# Patient Record
Sex: Female | Born: 1940 | Race: White | Hispanic: No | Marital: Married | Smoking: Never smoker
Health system: Southern US, Community
[De-identification: ages and names within clinical notes are randomized; demographics above are authoritative.]

## PROBLEM LIST (undated history)

## (undated) DIAGNOSIS — IMO0001 Reserved for inherently not codable concepts without codable children: Secondary | ICD-10-CM

## (undated) DIAGNOSIS — Z860100 Personal history of colon polyps, unspecified: Secondary | ICD-10-CM

## (undated) DIAGNOSIS — J189 Pneumonia, unspecified organism: Secondary | ICD-10-CM

## (undated) DIAGNOSIS — M549 Dorsalgia, unspecified: Secondary | ICD-10-CM

## (undated) DIAGNOSIS — F32A Depression, unspecified: Secondary | ICD-10-CM

## (undated) DIAGNOSIS — R531 Weakness: Secondary | ICD-10-CM

## (undated) DIAGNOSIS — M2540 Effusion, unspecified joint: Secondary | ICD-10-CM

## (undated) DIAGNOSIS — K649 Unspecified hemorrhoids: Secondary | ICD-10-CM

## (undated) DIAGNOSIS — E119 Type 2 diabetes mellitus without complications: Secondary | ICD-10-CM

## (undated) DIAGNOSIS — M810 Age-related osteoporosis without current pathological fracture: Secondary | ICD-10-CM

## (undated) DIAGNOSIS — I509 Heart failure, unspecified: Secondary | ICD-10-CM

## (undated) DIAGNOSIS — F329 Major depressive disorder, single episode, unspecified: Secondary | ICD-10-CM

## (undated) DIAGNOSIS — G2581 Restless legs syndrome: Secondary | ICD-10-CM

## (undated) DIAGNOSIS — E039 Hypothyroidism, unspecified: Secondary | ICD-10-CM

## (undated) DIAGNOSIS — K219 Gastro-esophageal reflux disease without esophagitis: Secondary | ICD-10-CM

## (undated) DIAGNOSIS — R51 Headache: Secondary | ICD-10-CM

## (undated) DIAGNOSIS — Z9109 Other allergy status, other than to drugs and biological substances: Secondary | ICD-10-CM

## (undated) DIAGNOSIS — R351 Nocturia: Secondary | ICD-10-CM

## (undated) DIAGNOSIS — G4730 Sleep apnea, unspecified: Secondary | ICD-10-CM

## (undated) DIAGNOSIS — M2550 Pain in unspecified joint: Secondary | ICD-10-CM

## (undated) DIAGNOSIS — E785 Hyperlipidemia, unspecified: Secondary | ICD-10-CM

## (undated) DIAGNOSIS — G629 Polyneuropathy, unspecified: Secondary | ICD-10-CM

## (undated) DIAGNOSIS — I10 Essential (primary) hypertension: Secondary | ICD-10-CM

## (undated) DIAGNOSIS — G8929 Other chronic pain: Secondary | ICD-10-CM

## (undated) DIAGNOSIS — R519 Headache, unspecified: Secondary | ICD-10-CM

## (undated) DIAGNOSIS — R112 Nausea with vomiting, unspecified: Secondary | ICD-10-CM

## (undated) DIAGNOSIS — R569 Unspecified convulsions: Secondary | ICD-10-CM

## (undated) DIAGNOSIS — Z9889 Other specified postprocedural states: Secondary | ICD-10-CM

## (undated) DIAGNOSIS — M1990 Unspecified osteoarthritis, unspecified site: Secondary | ICD-10-CM

## (undated) DIAGNOSIS — R350 Frequency of micturition: Secondary | ICD-10-CM

## (undated) HISTORY — PX: LUMBAR PUNCTURE: SHX1985

## (undated) HISTORY — PX: CERVICAL FUSION: SHX112

## (undated) HISTORY — PX: COLONOSCOPY: SHX174

## (undated) HISTORY — PX: BACK SURGERY: SHX140

## (undated) HISTORY — PX: VAGINA SURGERY: SHX829

## (undated) HISTORY — PX: CHOLECYSTECTOMY: SHX55

---

## 2001-08-11 ENCOUNTER — Encounter: Payer: Self-pay | Admitting: Neurosurgery

## 2001-08-11 ENCOUNTER — Ambulatory Visit (HOSPITAL_COMMUNITY): Admission: RE | Admit: 2001-08-11 | Discharge: 2001-08-11 | Payer: Self-pay | Admitting: Neurosurgery

## 2001-08-23 ENCOUNTER — Encounter: Payer: Self-pay | Admitting: Neurosurgery

## 2001-08-28 ENCOUNTER — Encounter: Payer: Self-pay | Admitting: Neurosurgery

## 2001-08-28 ENCOUNTER — Inpatient Hospital Stay (HOSPITAL_COMMUNITY): Admission: RE | Admit: 2001-08-28 | Discharge: 2001-08-30 | Payer: Self-pay | Admitting: Neurosurgery

## 2001-12-20 ENCOUNTER — Encounter: Admission: RE | Admit: 2001-12-20 | Discharge: 2001-12-20 | Payer: Self-pay | Admitting: Neurosurgery

## 2001-12-20 ENCOUNTER — Encounter: Payer: Self-pay | Admitting: Neurosurgery

## 2014-11-15 DIAGNOSIS — J189 Pneumonia, unspecified organism: Secondary | ICD-10-CM

## 2014-11-15 HISTORY — DX: Pneumonia, unspecified organism: J18.9

## 2015-02-23 ENCOUNTER — Other Ambulatory Visit: Payer: Self-pay | Admitting: Neurosurgery

## 2015-03-09 ENCOUNTER — Encounter (HOSPITAL_COMMUNITY)
Admission: RE | Admit: 2015-03-09 | Discharge: 2015-03-09 | Disposition: A | Discharge status: Home / Self Care | Payer: Medicare HMO | Source: Ambulatory Visit | Attending: Neurosurgery | Admitting: Neurosurgery

## 2015-03-09 ENCOUNTER — Encounter (HOSPITAL_COMMUNITY): Payer: Self-pay

## 2015-03-09 DIAGNOSIS — G4733 Obstructive sleep apnea (adult) (pediatric): Secondary | ICD-10-CM | POA: Diagnosis not present

## 2015-03-09 DIAGNOSIS — E119 Type 2 diabetes mellitus without complications: Secondary | ICD-10-CM | POA: Diagnosis not present

## 2015-03-09 DIAGNOSIS — E785 Hyperlipidemia, unspecified: Secondary | ICD-10-CM | POA: Insufficient documentation

## 2015-03-09 DIAGNOSIS — Z79899 Other long term (current) drug therapy: Secondary | ICD-10-CM | POA: Diagnosis not present

## 2015-03-09 DIAGNOSIS — I509 Heart failure, unspecified: Secondary | ICD-10-CM | POA: Diagnosis not present

## 2015-03-09 DIAGNOSIS — I10 Essential (primary) hypertension: Secondary | ICD-10-CM | POA: Diagnosis not present

## 2015-03-09 DIAGNOSIS — Z01812 Encounter for preprocedural laboratory examination: Secondary | ICD-10-CM | POA: Diagnosis not present

## 2015-03-09 DIAGNOSIS — Z7982 Long term (current) use of aspirin: Secondary | ICD-10-CM | POA: Diagnosis not present

## 2015-03-09 DIAGNOSIS — E039 Hypothyroidism, unspecified: Secondary | ICD-10-CM | POA: Insufficient documentation

## 2015-03-09 HISTORY — DX: Heart failure, unspecified: I50.9

## 2015-03-09 HISTORY — DX: Other specified postprocedural states: Z98.890

## 2015-03-09 HISTORY — DX: Reserved for inherently not codable concepts without codable children: IMO0001

## 2015-03-09 HISTORY — DX: Unspecified convulsions: R56.9

## 2015-03-09 HISTORY — DX: Hypothyroidism, unspecified: E03.9

## 2015-03-09 HISTORY — DX: Depression, unspecified: F32.A

## 2015-03-09 HISTORY — DX: Effusion, unspecified joint: M25.40

## 2015-03-09 HISTORY — DX: Pain in unspecified joint: M25.50

## 2015-03-09 HISTORY — DX: Personal history of colonic polyps: Z86.010

## 2015-03-09 HISTORY — DX: Type 2 diabetes mellitus without complications: E11.9

## 2015-03-09 HISTORY — DX: Headache, unspecified: R51.9

## 2015-03-09 HISTORY — DX: Unspecified hemorrhoids: K64.9

## 2015-03-09 HISTORY — DX: Age-related osteoporosis without current pathological fracture: M81.0

## 2015-03-09 HISTORY — DX: Dorsalgia, unspecified: M54.9

## 2015-03-09 HISTORY — DX: Hyperlipidemia, unspecified: E78.5

## 2015-03-09 HISTORY — DX: Nocturia: R35.1

## 2015-03-09 HISTORY — DX: Polyneuropathy, unspecified: G62.9

## 2015-03-09 HISTORY — DX: Weakness: R53.1

## 2015-03-09 HISTORY — DX: Frequency of micturition: R35.0

## 2015-03-09 HISTORY — DX: Personal history of colon polyps, unspecified: Z86.0100

## 2015-03-09 HISTORY — DX: Restless legs syndrome: G25.81

## 2015-03-09 HISTORY — DX: Headache: R51

## 2015-03-09 HISTORY — DX: Major depressive disorder, single episode, unspecified: F32.9

## 2015-03-09 HISTORY — DX: Unspecified osteoarthritis, unspecified site: M19.90

## 2015-03-09 HISTORY — DX: Other chronic pain: G89.29

## 2015-03-09 HISTORY — DX: Sleep apnea, unspecified: G47.30

## 2015-03-09 HISTORY — DX: Nausea with vomiting, unspecified: R11.2

## 2015-03-09 HISTORY — DX: Essential (primary) hypertension: I10

## 2015-03-09 HISTORY — DX: Pneumonia, unspecified organism: J18.9

## 2015-03-09 HISTORY — DX: Other allergy status, other than to drugs and biological substances: Z91.09

## 2015-03-09 LAB — BASIC METABOLIC PANEL
Anion gap: 10 (ref 5–15)
BUN: 19 mg/dL (ref 6–20)
CHLORIDE: 99 mmol/L — AB (ref 101–111)
CO2: 28 mmol/L (ref 22–32)
CREATININE: 0.98 mg/dL (ref 0.44–1.00)
Calcium: 9.9 mg/dL (ref 8.9–10.3)
GFR calc Af Amer: >60 mL/min (ref >60)
GFR calc non Af Amer: 56 mL/min — ABNORMAL LOW (ref >60)
GLUCOSE: 103 mg/dL — AB (ref 65–99)
POTASSIUM: 4 mmol/L (ref 3.5–5.1)
SODIUM: 137 mmol/L (ref 135–145)

## 2015-03-09 LAB — CBC
HCT: 41.9 % (ref 36.0–46.0)
Hemoglobin: 14.1 g/dL (ref 12.0–15.0)
MCH: 31.1 pg (ref 26.0–34.0)
MCHC: 33.7 g/dL (ref 30.0–36.0)
MCV: 92.5 fL (ref 78.0–100.0)
PLATELETS: 291 10*3/uL (ref 150–400)
RBC: 4.53 MIL/uL (ref 3.87–5.11)
RDW: 12.7 % (ref 11.5–15.5)
WBC: 7.7 10*3/uL (ref 4.0–10.5)

## 2015-03-09 LAB — SURGICAL PCR SCREEN
MRSA, PCR: NEGATIVE
Staphylococcus aureus: POSITIVE — AB

## 2015-03-09 LAB — GLUCOSE, CAPILLARY: GLUCOSE-CAPILLARY: 100 mg/dL — AB (ref 65–99)

## 2015-03-09 NOTE — Progress Notes (Signed)
[facility] [address] in [place]

## 2015-03-09 NOTE — Progress Notes (Signed)
Red,open area noticed on right shoulder.When I asked what happened she said she scratched the spot but another one noted on back. I notified Erie Noe bc it's in close proximity to where surgery will take place.Erie Noe will let Dr.Cram know.

## 2015-03-09 NOTE — Pre-Procedure Instructions (Addendum)
Chelsea Bowman  03/09/2015      [id number] [place], [place] - [address]  [place] [address] Phone: [phone] Fax: [phone]  [facility] [place], [place] - [address] [place] [address] Phone: [phone] Fax: [phone]    Your procedure is scheduled on Wed. Aug 31 @ 9:30 AM  Report to [facility] Admitting at 7:30 AM.  Call this number if you have problems the morning of surgery:  [phone]   Remember:  Do not eat food or drink liquids after midnight.  Take these medicines the morning of surgery with A SIP OF WATER Carvedilol(Coreg),Prozac(Fluoxetine),Synthroid(Levothyroxine), and Claritin(Loratadine)  How to Manage Your Diabetes Before Surgery   Why is it important to control my blood sugar before and after surgery?   Improving blood sugar levels before and after surgery helps healing and can limit problems.  A way of improving blood sugar control is eating a healthy diet by:  - Eating less sugar and carbohydrates  - Increasing activity/exercise  - Talk with your doctor about reaching your blood sugar goals  High blood sugars (greater than 180 mg/dL) can raise your risk of infections and slow down your recovery so you will need to focus on controlling your diabetes during the weeks before surgery.  Make sure that the doctor who takes care of your diabetes knows about your planned surgery including the date and location.  How do I manage my blood sugars before surgery?   Check your blood sugar at least 4 times a day, 2 days before surgery to make sure that they are not too high or low.   Check your blood sugar the morning of your surgery when you wake up and every 2               hours until you get to the Short-Stay unit.  If your blood sugar is less than 70 mg/dL, you will need to treat for low blood sugar by:  Treat a low blood sugar (less than 70  mg/dL) with 1/2 cup of clear juice (cranberry or apple), 4 glucose tablets, OR glucose gel.  Recheck blood sugar in 15 minutes after treatment (to make sure it is greater than 70 mg/dL).  If blood sugar is not greater than 70 mg/dL on re-check, call [phone] for further instructions.   Report your blood sugar to the Short-Stay nurse when you get to Short-Stay.  References:  [facility], 2007 "How to Manage your Diabetes Before and After Surgery".  What do I do about my diabetes medications?   Do not take oral diabetes medicines (pills) the morning of surgery.          Do not take other diabetes injectables the day of surgery including Byetta, Victoza, Bydureon, and Trulicity.    If your CBG is greater than 220 mg/dL, you may take 1/2 of your sliding scale (correction) dose of insulin.   For patients with "Insulin Pumps":  Contact your diabetes doctor for specific instructions before surgery.   Decrease basal insulin rates by 20% at midnight the night before surgery.  Note that if your surgery is planned to be longer than 2 hours, your insulin pump will be removed and intravenous (IV) insulin will be started and managed by the nurses and anesthesiologist.  You will be able to restart your insulin pump once you are awake and able to  manage it.  Make sure to bring insulin pump supplies to the hospital with you in case your site needs to be changed.                     Stop taking your Mobic,Ibuprofen,and Aspirin. No Goody's,BC's,Aleve,Fish Oil,or any Herbal Medications.    Do not wear jewelry, make-up or nail polish.  Do not wear lotions, powders, or perfumes.  You may wear deodorant.  Do not shave 48 hours prior to surgery.  Men may shave face and neck.  Do not bring valuables to the hospital.  [facility] is not responsible for any belongings or valuables.  Contacts, dentures or bridgework may not be worn into surgery.  Leave your  suitcase in the car.  After surgery it may be brought to your room.  For patients admitted to the hospital, discharge time will be determined by your treatment team.  Patients discharged the day of surgery will not be allowed to drive home.    Special instructions:  [place] - Preparing for Surgery  Before surgery, you can play an important role.  Because skin is not sterile, your skin needs to be as free of germs as possible.  You can reduce the number of germs on you skin by washing with CHG (chlorahexidine gluconate) soap before surgery.  CHG is an antiseptic cleaner which kills germs and bonds with the skin to continue killing germs even after washing.  Please DO NOT use if you have an allergy to CHG or antibacterial soaps.  If your skin becomes reddened/irritated stop using the CHG and inform your nurse when you arrive at Short Stay.  Do not shave (including legs and underarms) for at least 48 hours prior to the first CHG shower.  You may shave your face.  Please follow these instructions carefully:   1.  Shower with CHG Soap the night before surgery and the                                morning of Surgery.  2.  If you choose to wash your hair, wash your hair first as usual with your       normal shampoo.  3.  After you shampoo, rinse your hair and body thoroughly to remove the                      Shampoo.  4.  Use CHG as you would any other liquid soap.  You can apply chg directly       to the skin and wash gently with scrungie or a clean washcloth.  5.  Apply the CHG Soap to your body ONLY FROM THE NECK DOWN.        Do not use on open wounds or open sores.  Avoid contact with your eyes,       ears, mouth and genitals (private parts).  Wash genitals (private parts)       with your normal soap.  6.  Wash thoroughly, paying special attention to the area where your surgery        will be performed.  7.  Thoroughly rinse your body with warm water from the neck down.  8.  DO NOT  shower/wash with your normal soap after using and rinsing off       the CHG Soap.  9.  Pat yourself dry with a clean  towel.            10.  Wear clean pajamas.            11.  Place clean sheets on your bed the night of your first shower and do not        sleep with pets.  Day of Surgery  Do not apply any lotions/deoderants the morning of surgery.  Please wear clean clothes to the hospital/surgery center.    Please read over the following fact sheets that you were given. Pain Booklet, Coughing and Deep Breathing, MRSA Information and Surgical Site Infection Prevention

## 2015-03-09 NOTE — Progress Notes (Addendum)
Cardiologist is Dr.Smull in [place] saw one time about 10yrs ago [phone]) [phone]) visit under care everywhere  Sees PA Leretha Pol in [place] on [facility]  Echo unsure   Stress test unsure   Heart cath under care everywhere from 2014  EKG under care everywhere from 11/2014  CXR under care everywhere from 2016  Saw Pulmonologist about 3 yrs ago for shortness of breath [facility] Chest Specialist [phone]

## 2015-03-10 LAB — HEMOGLOBIN A1C
Hgb A1c MFr Bld: 6.2 % — ABNORMAL HIGH (ref 4.8–5.6)
Mean Plasma Glucose: 131 mg/dL

## 2015-03-11 ENCOUNTER — Encounter (HOSPITAL_COMMUNITY): Payer: Self-pay | Admitting: Emergency Medicine

## 2015-03-11 ENCOUNTER — Encounter (HOSPITAL_COMMUNITY): Payer: Self-pay

## 2015-03-11 NOTE — Progress Notes (Addendum)
Anesthesia Chart Review:  Pt is 74 year old female scheduled for C2-3 posterior cervical fusion with lateral mass fixation on 03/17/2015 with Dr. Wynetta Emery.   PMH includes: CHF, HTN, DM, hyperlipidemia, hypothyroidism, OSA (not on CPAP), remote history seizures. Never smoker. BMI 33.   Hospitalized 5/9-5/05/2015 (see care everywhere) for acute respiratory failure due to bilateral pneumonia.   Medications include: ASA, carvedilol, lasix, levothyroxine, lovastatin, metformin, potassium, mirapex.   Preoperative labs reviewed.  HgbA1c 6.2, glucose 103.   EKG 11/20/2014: NSR. Possible anterior infarct. Inferior infarct, age undetermined. Per Dr. Sima Matas' interpretation, no significant change when compared to prior tracing dated 04/16/2013.   Carotid duplex US 08/22/2013: 1. No hemodynamically significant stenosis on either side. Minimal atherosclerotic vascular disease at both carotid bifurcations.  2. Both vertebral arteries are patent with antegrade flow.  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 -The left ventricular ejection fraction is normal (55-60%). -The left atrium is mildly dilated. -Grade I mild diastolic dysfunction; abnormal relaxation pattern. -There is normal left ventricular wall thickness. -The left ventricular wall motion is normal. -The right ventricular ejection fraction is normal.  Per interpretation of [facility] Dr. Sima Matas, EKG stable since 04/16/13, which is the date of pt's normal cardiac cath. If no changes, I anticipate pt can proceed with surgery as scheduled.   Rica Mast, FNP-BC [facility] Short Stay Surgical Center/Anesthesiology Phone: [phone] 03/11/2015 2:20 PM

## 2015-03-16 MED ORDER — DEXAMETHASONE SODIUM PHOSPHATE 10 MG/ML IJ SOLN: 10.0000 mg | INTRAMUSCULAR | Status: DC

## 2015-03-16 MED ORDER — CEFAZOLIN SODIUM-DEXTROSE 2-3 GM-% IV SOLR: 2.0000 g | INTRAVENOUS | Status: DC

## 2015-03-16 NOTE — Progress Notes (Signed)
Left message for daughter and pt about new arrival time being 0930.I asked that one of them call back to confirm new time.

## 2015-03-17 ENCOUNTER — Encounter (HOSPITAL_COMMUNITY): Admission: RE | Payer: Medicare HMO | Source: Ambulatory Visit

## 2015-03-17 ENCOUNTER — Inpatient Hospital Stay (HOSPITAL_COMMUNITY): Admission: RE | Admit: 2015-03-17 | Payer: Medicare HMO | Source: Ambulatory Visit | Admitting: Neurosurgery

## 2015-03-17 SURGERY — POSTERIOR CERVICAL FUSION/FORAMINOTOMY LEVEL 1
Anesthesia: General

## 2015-06-14 ENCOUNTER — Other Ambulatory Visit (HOSPITAL_COMMUNITY): Payer: Self-pay | Admitting: Neurosurgery

## 2015-06-30 ENCOUNTER — Encounter (HOSPITAL_COMMUNITY)
Admission: RE | Admit: 2015-06-30 | Discharge: 2015-06-30 | Disposition: A | Discharge status: Home / Self Care | Payer: Medicare HMO | Source: Ambulatory Visit | Attending: Neurosurgery | Admitting: Neurosurgery

## 2015-06-30 ENCOUNTER — Encounter (HOSPITAL_COMMUNITY): Payer: Self-pay

## 2015-06-30 DIAGNOSIS — Z7984 Long term (current) use of oral hypoglycemic drugs: Secondary | ICD-10-CM | POA: Diagnosis not present

## 2015-06-30 DIAGNOSIS — E039 Hypothyroidism, unspecified: Secondary | ICD-10-CM | POA: Insufficient documentation

## 2015-06-30 DIAGNOSIS — Z01812 Encounter for preprocedural laboratory examination: Secondary | ICD-10-CM | POA: Insufficient documentation

## 2015-06-30 DIAGNOSIS — Z01818 Encounter for other preprocedural examination: Secondary | ICD-10-CM | POA: Insufficient documentation

## 2015-06-30 DIAGNOSIS — G4733 Obstructive sleep apnea (adult) (pediatric): Secondary | ICD-10-CM | POA: Diagnosis not present

## 2015-06-30 DIAGNOSIS — Z7982 Long term (current) use of aspirin: Secondary | ICD-10-CM | POA: Diagnosis not present

## 2015-06-30 DIAGNOSIS — Z0183 Encounter for blood typing: Secondary | ICD-10-CM | POA: Insufficient documentation

## 2015-06-30 DIAGNOSIS — Z79899 Other long term (current) drug therapy: Secondary | ICD-10-CM | POA: Diagnosis not present

## 2015-06-30 DIAGNOSIS — E785 Hyperlipidemia, unspecified: Secondary | ICD-10-CM | POA: Diagnosis not present

## 2015-06-30 DIAGNOSIS — E119 Type 2 diabetes mellitus without complications: Secondary | ICD-10-CM | POA: Insufficient documentation

## 2015-06-30 DIAGNOSIS — I110 Hypertensive heart disease with heart failure: Secondary | ICD-10-CM | POA: Insufficient documentation

## 2015-06-30 DIAGNOSIS — I509 Heart failure, unspecified: Secondary | ICD-10-CM | POA: Diagnosis not present

## 2015-06-30 LAB — TYPE AND SCREEN
ABO/RH(D): A POS
Antibody Screen: NEGATIVE

## 2015-06-30 LAB — SURGICAL PCR SCREEN
MRSA, PCR: NEGATIVE
Staphylococcus aureus: NEGATIVE

## 2015-06-30 LAB — CBC
HCT: 44 % (ref 36.0–46.0)
Hemoglobin: 14 g/dL (ref 12.0–15.0)
MCH: 29.7 pg (ref 26.0–34.0)
MCHC: 31.8 g/dL (ref 30.0–36.0)
MCV: 93.2 fL (ref 78.0–100.0)
PLATELETS: 287 10*3/uL (ref 150–400)
RBC: 4.72 MIL/uL (ref 3.87–5.11)
RDW: 12.5 % (ref 11.5–15.5)
WBC: 6.5 10*3/uL (ref 4.0–10.5)

## 2015-06-30 LAB — BASIC METABOLIC PANEL
Anion gap: 7 (ref 5–15)
BUN: 14 mg/dL (ref 6–20)
CALCIUM: 9.9 mg/dL (ref 8.9–10.3)
CHLORIDE: 103 mmol/L (ref 101–111)
CO2: 28 mmol/L (ref 22–32)
CREATININE: 0.94 mg/dL (ref 0.44–1.00)
GFR calc non Af Amer: 59 mL/min — ABNORMAL LOW (ref >60)
Glucose, Bld: 108 mg/dL — ABNORMAL HIGH (ref 65–99)
Potassium: 4.7 mmol/L (ref 3.5–5.1)
SODIUM: 138 mmol/L (ref 135–145)

## 2015-06-30 LAB — GLUCOSE, CAPILLARY: GLUCOSE-CAPILLARY: 102 mg/dL — AB (ref 65–99)

## 2015-06-30 LAB — ABO/RH: ABO/RH(D): A POS

## 2015-06-30 NOTE — Progress Notes (Addendum)
PCP: Florene Glenkatie wingate, PA@ [facility] Neurologist: Dr. Larose KellsLauve, pt. Also sees her for sleep apnea. States she doesn't use cpap-uncomfortable and she is up and down at night. Sleep study done 2 yrs. Ago-doesn't remember the doctor or [place].  Pt. Doesn't see a cardiologist at present. Saw Dr.Smull in [place] and he released her back in 2014.  Will request ekg tracing at [facility]. Pt. Reports she doesn't check blood sugars. Encourage her to do so and also on the day of surgery.  Pt. Stated why this surgery was cancelled back in August was due to a rash that developed on her right shoulder. States it clear now and they didn't know what caused it.

## 2015-06-30 NOTE — Pre-Procedure Instructions (Addendum)
Chelsea Bowman  06/30/2015      [id number] [place], [place] - [address] [place] [address] Phone: [phone] Fax: [phone]  [facility] [place], [place] - [address] [place] [address] Phone: [phone] Fax: [phone]    Your procedure is scheduled on Monday, Dec. 19  Report to [facility] Admitting at 11:55  A.M.  Call this number if you have problems the morning of surgery:  [phone]   Remember:  Do not eat food or drink liquids after midnight on Sunday Dec. 18  Take these medicines the morning of surgery with A SIP OF WATER : albuterol if needed-bring to hospital, carvedilol(coreg), prozac, levothyroxine(synthroid), claritin               Stop:meloxicam (mobic), ibuprofen, advil, motrin, aspirin, and vitamins today.                How to Manage Your Diabetes Before Surgery   Why is it important to control my blood sugar before and after surgery?   Improving blood sugar levels before and after surgery helps healing and can limit problems.  A way of improving blood sugar control is eating a healthy diet by:  - Eating less sugar and carbohydrates  - Increasing activity/exercise  - Talk with your doctor about reaching your blood sugar goals  High blood sugars (greater than 180 mg/dL) can raise your risk of infections and slow down your recovery so you will need to focus on controlling your diabetes during the weeks before surgery.  Make sure that the doctor who takes care of your diabetes knows about your planned surgery including the date and location.  How do I manage my blood sugars before surgery?   Check your blood sugar at least 4 times a day, 2 days before surgery to make sure that they are not too high or low.   Check your blood sugar the morning of your surgery when you wake up and every 2               hours until you get to the Short-Stay  unit.  If your blood sugar is less than 70 mg/dL, you will need to treat for low blood sugar by:  Treat a low blood sugar (less than 70 mg/dL) with 1/2 cup of clear juice (cranberry or apple), 4 glucose tablets, OR glucose gel.  Recheck blood sugar in 15 minutes after treatment (to make sure it is greater than 70 mg/dL).  If blood sugar is not greater than 70 mg/dL on re-check, call [phone] for further instructions.   Report your blood sugar to the Short-Stay nurse when you get to Short-Stay.  References:  [facility], 2007 "How to Manage your Diabetes Before and After Surgery".  What do I do about my diabetes medications?    Do not take oral diabetes medicines (pills) the morning of surgery.    Do not wear jewelry, make-up or nail polish.  Do not wear lotions, powders, or perfumes.  You may wear deodorant.  Do not shave 48 hours prior to surgery.  Men may shave face and neck.  Do not bring valuables to the hospital.  [facility] is not responsible for any belongings or valuables.  Contacts, dentures or bridgework may not be worn into surgery.  Leave your suitcase in the car.  After surgery it may be  brought to your room.  For patients admitted to the hospital, discharge time will be determined by your treatment team.  Patients discharged the day of surgery will not be allowed to drive home.    Special instructions: review preparing for surgery  Please read over the following fact sheets that you were given. Pain Booklet, Coughing and Deep Breathing, Blood Transfusion Information, MRSA Information and Surgical Site Infection Prevention

## 2015-07-01 LAB — HEMOGLOBIN A1C
HEMOGLOBIN A1C: 6 % — AB (ref 4.8–5.6)
Mean Plasma Glucose: 126 mg/dL

## 2015-07-01 NOTE — Progress Notes (Signed)
Anesthesia Chart Review:  Pt is 74 year old female scheduled for C2-3 posterior cervical fusion with lateral mass fixation on 07/05/2015 with Dr. Wynetta Emeryram.   Pt was originally scheduled for this surgery back in August but it was cancelled due to a rash.   PMH includes: CHF, HTN, DM, hyperlipidemia, hypothyroidism, OSA (not on CPAP), remote history seizures. Never smoker. BMI 33.5.   Hospitalized 5/9-5/05/2015 (see care everywhere) for acute respiratory failure due to bilateral pneumonia.   Medications include: albuterol, ASA, carvedilol, lasix, levothyroxine, lovastatin, metformin, potassium, mirapex.   Preoperative labs reviewed. HgbA1c 6.0, glucose 108.   EKG 11/20/2014 (care everywhere): NSR. Possible anterior infarct. Inferior infarct, age undetermined. Per Dr. Sima MatasSteve Collins' interpretation, no significant change when compared to prior tracing dated 04/16/2013.   Carotid duplex US 08/22/2013 (care everywhere): 1. No hemodynamically significant stenosis on either side. Minimal atherosclerotic vascular disease at both carotid bifurcations.  2. Both vertebral arteries are patent with antegrade flow.  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 -The left ventricular ejection fraction is normal (55-60%). -The left atrium is mildly dilated. -Grade I mild diastolic dysfunction; abnormal relaxation pattern. -There is normal left ventricular wall thickness. -The left ventricular wall motion is normal. -The right ventricular ejection fraction is normal.  Per interpretation of [facility] Dr. Sima MatasSteve Collins, EKG stable since 04/16/13, which is the date of pt's normal cardiac cath. If no changes, I anticipate pt can proceed with surgery as scheduled.   Rica Mastngela Josedaniel Haye, FNP-BC [facility] Short Stay Surgical  Center/Anesthesiology Phone: [phone] 07/01/2015 2:57 PM

## 2015-07-02 NOTE — Progress Notes (Signed)
[facility], spoke with Morrie SheldonAshley , she will fax ekg again, she reports that they did rec. request & she had faxed it to us on 06/30/2015

## 2015-07-04 MED ORDER — DEXAMETHASONE SODIUM PHOSPHATE 10 MG/ML IJ SOLN
10.0000 mg | INTRAMUSCULAR | Status: DC
  Filled 2015-07-04: qty 1

## 2015-07-04 MED ORDER — CEFAZOLIN SODIUM-DEXTROSE 2-3 GM-% IV SOLR
2.0000 g | INTRAVENOUS | Status: AC
  Administered 2015-07-05: 2 g via INTRAVENOUS
  Filled 2015-07-04: qty 50

## 2015-07-05 ENCOUNTER — Encounter (HOSPITAL_COMMUNITY)
Admission: RE | Disposition: A | Discharge status: Home / Self Care | Payer: Self-pay | Source: Ambulatory Visit | Attending: Neurosurgery

## 2015-07-05 ENCOUNTER — Inpatient Hospital Stay (HOSPITAL_COMMUNITY): Payer: Medicare HMO

## 2015-07-05 ENCOUNTER — Inpatient Hospital Stay (HOSPITAL_COMMUNITY): Payer: Medicare HMO | Admitting: Certified Registered Nurse Anesthetist

## 2015-07-05 ENCOUNTER — Inpatient Hospital Stay (HOSPITAL_COMMUNITY)
Admission: RE | Admit: 2015-07-05 | Discharge: 2015-07-06 | DRG: 472 | Disposition: A | Discharge status: Home / Self Care | Payer: Medicare HMO | Source: Ambulatory Visit | Attending: Neurosurgery | Admitting: Neurosurgery

## 2015-07-05 ENCOUNTER — Inpatient Hospital Stay (HOSPITAL_COMMUNITY): Payer: Medicare HMO | Admitting: Emergency Medicine

## 2015-07-05 ENCOUNTER — Encounter (HOSPITAL_COMMUNITY): Payer: Self-pay | Admitting: General Practice

## 2015-07-05 DIAGNOSIS — M4802 Spinal stenosis, cervical region: Secondary | ICD-10-CM | POA: Diagnosis present

## 2015-07-05 DIAGNOSIS — I509 Heart failure, unspecified: Secondary | ICD-10-CM | POA: Diagnosis present

## 2015-07-05 DIAGNOSIS — Z419 Encounter for procedure for purposes other than remedying health state, unspecified: Secondary | ICD-10-CM

## 2015-07-05 DIAGNOSIS — Z79899 Other long term (current) drug therapy: Secondary | ICD-10-CM | POA: Diagnosis not present

## 2015-07-05 DIAGNOSIS — F329 Major depressive disorder, single episode, unspecified: Secondary | ICD-10-CM | POA: Diagnosis present

## 2015-07-05 DIAGNOSIS — M810 Age-related osteoporosis without current pathological fracture: Secondary | ICD-10-CM | POA: Diagnosis present

## 2015-07-05 DIAGNOSIS — G4730 Sleep apnea, unspecified: Secondary | ICD-10-CM | POA: Diagnosis present

## 2015-07-05 DIAGNOSIS — M4712 Other spondylosis with myelopathy, cervical region: Secondary | ICD-10-CM | POA: Diagnosis present

## 2015-07-05 DIAGNOSIS — Z981 Arthrodesis status: Secondary | ICD-10-CM | POA: Diagnosis not present

## 2015-07-05 DIAGNOSIS — Z86010 Personal history of colonic polyps: Secondary | ICD-10-CM

## 2015-07-05 DIAGNOSIS — G8929 Other chronic pain: Secondary | ICD-10-CM | POA: Diagnosis present

## 2015-07-05 DIAGNOSIS — E1140 Type 2 diabetes mellitus with diabetic neuropathy, unspecified: Secondary | ICD-10-CM | POA: Diagnosis present

## 2015-07-05 DIAGNOSIS — Z7982 Long term (current) use of aspirin: Secondary | ICD-10-CM

## 2015-07-05 DIAGNOSIS — Z7984 Long term (current) use of oral hypoglycemic drugs: Secondary | ICD-10-CM | POA: Diagnosis not present

## 2015-07-05 DIAGNOSIS — G2581 Restless legs syndrome: Secondary | ICD-10-CM | POA: Diagnosis present

## 2015-07-05 DIAGNOSIS — M542 Cervicalgia: Secondary | ICD-10-CM | POA: Diagnosis present

## 2015-07-05 DIAGNOSIS — E785 Hyperlipidemia, unspecified: Secondary | ICD-10-CM | POA: Diagnosis present

## 2015-07-05 DIAGNOSIS — I110 Hypertensive heart disease with heart failure: Secondary | ICD-10-CM | POA: Diagnosis present

## 2015-07-05 DIAGNOSIS — E039 Hypothyroidism, unspecified: Secondary | ICD-10-CM | POA: Diagnosis present

## 2015-07-05 DIAGNOSIS — G959 Disease of spinal cord, unspecified: Secondary | ICD-10-CM | POA: Diagnosis present

## 2015-07-05 HISTORY — PX: POSTERIOR CERVICAL FUSION/FORAMINOTOMY: SHX5038

## 2015-07-05 LAB — GLUCOSE, CAPILLARY
GLUCOSE-CAPILLARY: 118 mg/dL — AB (ref 65–99)
Glucose-Capillary: 117 mg/dL — ABNORMAL HIGH (ref 65–99)
Glucose-Capillary: 180 mg/dL — ABNORMAL HIGH (ref 65–99)

## 2015-07-05 SURGERY — POSTERIOR CERVICAL FUSION/FORAMINOTOMY LEVEL 1
Anesthesia: General | Site: Neck

## 2015-07-05 MED ORDER — POTASSIUM CHLORIDE ER 10 MEQ PO TBCR
10.0000 meq | EXTENDED_RELEASE_TABLET | Freq: Two times a day (BID) | ORAL | Status: DC
  Administered 2015-07-05 – 2015-07-06 (×2): 10 meq via ORAL
  Filled 2015-07-05 (×5): qty 1

## 2015-07-05 MED ORDER — OXYCODONE-ACETAMINOPHEN 5-325 MG PO TABS
1.0000 | ORAL_TABLET | ORAL | Status: DC | PRN
  Administered 2015-07-05 – 2015-07-06 (×5): 2 via ORAL
  Filled 2015-07-05 (×4): qty 2

## 2015-07-05 MED ORDER — ONDANSETRON HCL 4 MG/2ML IJ SOLN: 4.0000 mg | INTRAMUSCULAR | Status: DC | PRN

## 2015-07-05 MED ORDER — MIDAZOLAM HCL 2 MG/2ML IJ SOLN
INTRAMUSCULAR | Status: AC
  Filled 2015-07-05: qty 2

## 2015-07-05 MED ORDER — LIDOCAINE HCL (CARDIAC) 20 MG/ML IV SOLN
INTRAVENOUS | Status: AC
  Filled 2015-07-05: qty 5

## 2015-07-05 MED ORDER — EPHEDRINE SULFATE 50 MG/ML IJ SOLN
INTRAMUSCULAR | Status: DC | PRN
  Administered 2015-07-05: 5 mg via INTRAVENOUS
  Administered 2015-07-05: 10 mg via INTRAVENOUS
  Administered 2015-07-05: 15 mg via INTRAVENOUS

## 2015-07-05 MED ORDER — VITAMIN D3 25 MCG (1000 UNIT) PO TABS
1000.0000 [IU] | ORAL_TABLET | Freq: Every day | ORAL | Status: DC
  Administered 2015-07-06: 1000 [IU] via ORAL
  Filled 2015-07-05 (×2): qty 1

## 2015-07-05 MED ORDER — LIDOCAINE HCL (CARDIAC) 20 MG/ML IV SOLN
INTRAVENOUS | Status: DC | PRN
  Administered 2015-07-05: 60 mg via INTRAVENOUS

## 2015-07-05 MED ORDER — NEOSTIGMINE METHYLSULFATE 10 MG/10ML IV SOLN
INTRAVENOUS | Status: DC | PRN
  Administered 2015-07-05: 3 mg via INTRAVENOUS

## 2015-07-05 MED ORDER — 0.9 % SODIUM CHLORIDE (POUR BTL) OPTIME
TOPICAL | Status: DC | PRN
  Administered 2015-07-05: 1000 mL

## 2015-07-05 MED ORDER — FENTANYL CITRATE (PF) 250 MCG/5ML IJ SOLN
INTRAMUSCULAR | Status: AC
  Filled 2015-07-05: qty 5

## 2015-07-05 MED ORDER — ONDANSETRON HCL 4 MG/2ML IJ SOLN: 4.0000 mg | Freq: Once | INTRAMUSCULAR | Status: DC | PRN

## 2015-07-05 MED ORDER — CYCLOBENZAPRINE HCL 10 MG PO TABS
ORAL_TABLET | ORAL | Status: AC
  Filled 2015-07-05: qty 1

## 2015-07-05 MED ORDER — FENTANYL CITRATE (PF) 100 MCG/2ML IJ SOLN
INTRAMUSCULAR | Status: DC | PRN
  Administered 2015-07-05 (×5): 50 ug via INTRAVENOUS

## 2015-07-05 MED ORDER — CARVEDILOL 6.25 MG PO TABS
6.2500 mg | ORAL_TABLET | Freq: Two times a day (BID) | ORAL | Status: DC
  Administered 2015-07-06: 6.25 mg via ORAL
  Filled 2015-07-05: qty 1

## 2015-07-05 MED ORDER — SODIUM CHLORIDE 0.9 % IR SOLN
Status: DC | PRN
  Administered 2015-07-05: 16:00:00

## 2015-07-05 MED ORDER — PROPOFOL 10 MG/ML IV BOLUS
INTRAVENOUS | Status: AC
  Filled 2015-07-05: qty 20

## 2015-07-05 MED ORDER — LORATADINE 10 MG PO TABS
10.0000 mg | ORAL_TABLET | Freq: Every day | ORAL | Status: DC
  Administered 2015-07-06: 10 mg via ORAL
  Filled 2015-07-05: qty 1

## 2015-07-05 MED ORDER — ARTIFICIAL TEARS OP OINT
TOPICAL_OINTMENT | OPHTHALMIC | Status: AC
  Filled 2015-07-05: qty 3.5

## 2015-07-05 MED ORDER — VANCOMYCIN HCL 1000 MG IV SOLR
INTRAVENOUS | Status: AC
  Filled 2015-07-05: qty 1000

## 2015-07-05 MED ORDER — ONDANSETRON HCL 4 MG/2ML IJ SOLN
INTRAMUSCULAR | Status: AC
  Filled 2015-07-05: qty 2

## 2015-07-05 MED ORDER — PROPOFOL 10 MG/ML IV BOLUS
INTRAVENOUS | Status: DC | PRN
  Administered 2015-07-05: 120 mg via INTRAVENOUS
  Administered 2015-07-05: 30 mg via INTRAVENOUS

## 2015-07-05 MED ORDER — ACETAMINOPHEN 325 MG PO TABS: 650.0000 mg | ORAL_TABLET | ORAL | Status: DC | PRN

## 2015-07-05 MED ORDER — ONDANSETRON HCL 4 MG/2ML IJ SOLN
INTRAMUSCULAR | Status: DC | PRN
  Administered 2015-07-05: 4 mg via INTRAVENOUS

## 2015-07-05 MED ORDER — LIDOCAINE-EPINEPHRINE 1 %-1:100000 IJ SOLN
INTRAMUSCULAR | Status: DC | PRN
  Administered 2015-07-05: 9 mL

## 2015-07-05 MED ORDER — MIDAZOLAM HCL 5 MG/5ML IJ SOLN
INTRAMUSCULAR | Status: DC | PRN
  Administered 2015-07-05: 1 mg via INTRAVENOUS

## 2015-07-05 MED ORDER — ROCURONIUM BROMIDE 100 MG/10ML IV SOLN
INTRAVENOUS | Status: DC | PRN
  Administered 2015-07-05: 10 mg via INTRAVENOUS
  Administered 2015-07-05: 40 mg via INTRAVENOUS
  Administered 2015-07-05: 10 mg via INTRAVENOUS

## 2015-07-05 MED ORDER — FENTANYL CITRATE (PF) 100 MCG/2ML IJ SOLN
INTRAMUSCULAR | Status: AC
  Administered 2015-07-05: 25 ug via INTRAVENOUS
  Filled 2015-07-05: qty 2

## 2015-07-05 MED ORDER — FENTANYL CITRATE (PF) 100 MCG/2ML IJ SOLN
25.0000 ug | INTRAMUSCULAR | Status: DC | PRN
  Administered 2015-07-05: 50 ug via INTRAVENOUS
  Administered 2015-07-05 (×2): 25 ug via INTRAVENOUS

## 2015-07-05 MED ORDER — FLUOXETINE HCL 20 MG PO CAPS
40.0000 mg | ORAL_CAPSULE | Freq: Every day | ORAL | Status: DC
  Administered 2015-07-06: 40 mg via ORAL
  Filled 2015-07-05: qty 2

## 2015-07-05 MED ORDER — ALBUTEROL SULFATE HFA 108 (90 BASE) MCG/ACT IN AERS
1.0000 | INHALATION_SPRAY | Freq: Four times a day (QID) | RESPIRATORY_TRACT | Status: DC | PRN
Start: 2015-07-05 — End: 2015-07-05

## 2015-07-05 MED ORDER — HYDROMORPHONE HCL 1 MG/ML IJ SOLN: 0.5000 mg | INTRAMUSCULAR | Status: DC | PRN

## 2015-07-05 MED ORDER — SODIUM CHLORIDE 0.9 % IJ SOLN
3.0000 mL | Freq: Two times a day (BID) | INTRAMUSCULAR | Status: DC
  Administered 2015-07-05: 3 mL via INTRAVENOUS

## 2015-07-05 MED ORDER — FUROSEMIDE 40 MG PO TABS
40.0000 mg | ORAL_TABLET | Freq: Every day | ORAL | Status: DC
  Administered 2015-07-06: 40 mg via ORAL
  Filled 2015-07-05: qty 1

## 2015-07-05 MED ORDER — METFORMIN HCL 500 MG PO TABS
500.0000 mg | ORAL_TABLET | Freq: Every day | ORAL | Status: DC
  Administered 2015-07-06: 500 mg via ORAL
  Filled 2015-07-05: qty 1

## 2015-07-05 MED ORDER — PHENOL 1.4 % MT LIQD: 1.0000 | OROMUCOSAL | Status: DC | PRN

## 2015-07-05 MED ORDER — DEXAMETHASONE SODIUM PHOSPHATE 10 MG/ML IJ SOLN
INTRAMUSCULAR | Status: DC | PRN
  Administered 2015-07-05: 10 mg via INTRAVENOUS

## 2015-07-05 MED ORDER — VANCOMYCIN HCL 1000 MG IV SOLR
INTRAVENOUS | Status: DC | PRN
  Administered 2015-07-05: 1000 mg via TOPICAL

## 2015-07-05 MED ORDER — MENTHOL 3 MG MT LOZG
1.0000 | LOZENGE | OROMUCOSAL | Status: DC | PRN
  Filled 2015-07-05: qty 9

## 2015-07-05 MED ORDER — BACITRACIN ZINC 500 UNIT/GM EX OINT
TOPICAL_OINTMENT | CUTANEOUS | Status: DC | PRN
  Administered 2015-07-05: 1 via TOPICAL

## 2015-07-05 MED ORDER — VANCOMYCIN HCL IN DEXTROSE 1-5 GM/200ML-% IV SOLN
1000.0000 mg | Freq: Two times a day (BID) | INTRAVENOUS | Status: DC
  Administered 2015-07-05 – 2015-07-06 (×2): 1000 mg via INTRAVENOUS
  Filled 2015-07-05 (×3): qty 200

## 2015-07-05 MED ORDER — ROCURONIUM BROMIDE 50 MG/5ML IV SOLN
INTRAVENOUS | Status: AC
  Filled 2015-07-05: qty 1

## 2015-07-05 MED ORDER — DIPHENHYDRAMINE HCL 25 MG PO CAPS: 25.0000 mg | ORAL_CAPSULE | Freq: Three times a day (TID) | ORAL | Status: DC | PRN

## 2015-07-05 MED ORDER — ALUM & MAG HYDROXIDE-SIMETH 200-200-20 MG/5ML PO SUSP: 30.0000 mL | Freq: Four times a day (QID) | ORAL | Status: DC | PRN

## 2015-07-05 MED ORDER — OXYCODONE-ACETAMINOPHEN 5-325 MG PO TABS
ORAL_TABLET | ORAL | Status: AC
  Administered 2015-07-05: 2 via ORAL
  Filled 2015-07-05: qty 2

## 2015-07-05 MED ORDER — ASPIRIN EC 81 MG PO TBEC
81.0000 mg | DELAYED_RELEASE_TABLET | Freq: Every day | ORAL | Status: DC
  Administered 2015-07-06: 81 mg via ORAL
  Filled 2015-07-05: qty 1

## 2015-07-05 MED ORDER — ACETAMINOPHEN 650 MG RE SUPP: 650.0000 mg | RECTAL | Status: DC | PRN

## 2015-07-05 MED ORDER — LACTATED RINGERS IV SOLN
INTRAVENOUS | Status: DC | PRN
Start: 2015-07-05 — End: 2015-07-05
  Administered 2015-07-05 (×2): via INTRAVENOUS

## 2015-07-05 MED ORDER — THROMBIN 20000 UNITS EX SOLR
CUTANEOUS | Status: DC | PRN
  Administered 2015-07-05: 16:00:00 via TOPICAL

## 2015-07-05 MED ORDER — GLYCOPYRROLATE 0.2 MG/ML IJ SOLN
INTRAMUSCULAR | Status: AC
  Filled 2015-07-05: qty 2

## 2015-07-05 MED ORDER — PRAVASTATIN SODIUM 10 MG PO TABS
10.0000 mg | ORAL_TABLET | Freq: Every day | ORAL | Status: DC
  Administered 2015-07-05: 10 mg via ORAL
  Filled 2015-07-05 (×2): qty 1

## 2015-07-05 MED ORDER — LEVOTHYROXINE SODIUM 75 MCG PO TABS
75.0000 ug | ORAL_TABLET | Freq: Every day | ORAL | Status: DC
  Administered 2015-07-06: 75 ug via ORAL
  Filled 2015-07-05 (×3): qty 1

## 2015-07-05 MED ORDER — CYCLOBENZAPRINE HCL 10 MG PO TABS
10.0000 mg | ORAL_TABLET | Freq: Three times a day (TID) | ORAL | Status: DC | PRN
  Administered 2015-07-05 – 2015-07-06 (×2): 10 mg via ORAL
  Filled 2015-07-05: qty 1

## 2015-07-05 MED ORDER — LACTATED RINGERS IV SOLN
INTRAVENOUS | Status: DC
  Administered 2015-07-05: 13:00:00 via INTRAVENOUS

## 2015-07-05 MED ORDER — NEOSTIGMINE METHYLSULFATE 10 MG/10ML IV SOLN
INTRAVENOUS | Status: AC
  Filled 2015-07-05: qty 1

## 2015-07-05 MED ORDER — BUPIVACAINE HCL (PF) 0.25 % IJ SOLN
INTRAMUSCULAR | Status: DC | PRN
  Administered 2015-07-05: 10 mL

## 2015-07-05 MED ORDER — DEXTROSE 5 % IV SOLN
10.0000 mg | INTRAVENOUS | Status: DC | PRN
  Administered 2015-07-05: 25 ug/min via INTRAVENOUS

## 2015-07-05 MED ORDER — SODIUM CHLORIDE 0.9 % IJ SOLN: 3.0000 mL | INTRAMUSCULAR | Status: DC | PRN

## 2015-07-05 MED ORDER — GLYCOPYRROLATE 0.2 MG/ML IJ SOLN
INTRAMUSCULAR | Status: DC | PRN
  Administered 2015-07-05: 0.4 mg via INTRAVENOUS
  Administered 2015-07-05: 0.2 mg via INTRAVENOUS

## 2015-07-05 MED ORDER — DEXAMETHASONE SODIUM PHOSPHATE 10 MG/ML IJ SOLN
INTRAMUSCULAR | Status: AC
  Filled 2015-07-05: qty 1

## 2015-07-05 MED ORDER — PRAMIPEXOLE DIHYDROCHLORIDE 0.25 MG PO TABS
0.7500 mg | ORAL_TABLET | Freq: Every day | ORAL | Status: DC
  Administered 2015-07-05: 0.75 mg via ORAL
  Filled 2015-07-05 (×2): qty 3

## 2015-07-05 SURGICAL SUPPLY — 74 items
BAG DECANTER FOR FLEXI CONT (MISCELLANEOUS) ×3 IMPLANT
BENZOIN TINCTURE PRP APPL 2/3 (GAUZE/BANDAGES/DRESSINGS) ×6 IMPLANT
BIT DRILL 3.5 SHORT (BIT) ×2 IMPLANT
BIT DRILL 3.5MM SHORT (BIT) ×1
BLADE CLIPPER SURG (BLADE) ×3 IMPLANT
BLADE SURG 11 STRL SS (BLADE) ×3 IMPLANT
BUR MATCHSTICK NEURO 3.0 LAGG (BURR) ×3 IMPLANT
CANISTER SUCT 3000ML PPV (MISCELLANEOUS) ×3 IMPLANT
CAP LOCKING (Cap) ×4 IMPLANT
CLOSURE WOUND 1/2 X4 (GAUZE/BANDAGES/DRESSINGS) ×1
DECANTER SPIKE VIAL GLASS SM (MISCELLANEOUS) ×3 IMPLANT
DRAPE C-ARM 42X72 X-RAY (DRAPES) ×6 IMPLANT
DRAPE LAPAROTOMY 100X72 PEDS (DRAPES) ×3 IMPLANT
DRAPE MICROSCOPE LEICA (MISCELLANEOUS) IMPLANT
DRAPE POUCH INSTRU U-SHP 10X18 (DRAPES) ×3 IMPLANT
DRAPE SURG 17X23 STRL (DRAPES) ×12 IMPLANT
DRSG TELFA 3X8 NADH (GAUZE/BANDAGES/DRESSINGS) ×3 IMPLANT
DURAPREP 26ML APPLICATOR (WOUND CARE) ×3 IMPLANT
ELECT REM PT RETURN 9FT ADLT (ELECTROSURGICAL) ×3
ELECTRODE REM PT RTRN 9FT ADLT (ELECTROSURGICAL) ×1 IMPLANT
EVACUATOR 1/8 PVC DRAIN (DRAIN) ×3 IMPLANT
GAUZE SPONGE 4X4 12PLY STRL (GAUZE/BANDAGES/DRESSINGS) IMPLANT
GAUZE SPONGE 4X4 16PLY XRAY LF (GAUZE/BANDAGES/DRESSINGS) IMPLANT
GLOVE BIO SURGEON STRL SZ8 (GLOVE) IMPLANT
GLOVE BIOGEL PI IND STRL 6.5 (GLOVE) ×2 IMPLANT
GLOVE BIOGEL PI IND STRL 7.5 (GLOVE) ×1 IMPLANT
GLOVE BIOGEL PI IND STRL 8.5 (GLOVE) ×1 IMPLANT
GLOVE BIOGEL PI INDICATOR 6.5 (GLOVE) ×4
GLOVE BIOGEL PI INDICATOR 7.5 (GLOVE) ×2
GLOVE BIOGEL PI INDICATOR 8.5 (GLOVE) ×2
GLOVE EXAM NITRILE LRG STRL (GLOVE) IMPLANT
GLOVE EXAM NITRILE MD LF STRL (GLOVE) IMPLANT
GLOVE EXAM NITRILE XL STR (GLOVE) IMPLANT
GLOVE EXAM NITRILE XS STR PU (GLOVE) IMPLANT
GLOVE INDICATOR 8.5 STRL (GLOVE) IMPLANT
GLOVE OPTIFIT SS 6.5 STRL BRWN (GLOVE) ×15 IMPLANT
GLOVE SS N UNI LF 8.0 STRL (GLOVE) ×3 IMPLANT
GOWN STRL REUS W/ TWL LRG LVL3 (GOWN DISPOSABLE) ×2 IMPLANT
GOWN STRL REUS W/ TWL XL LVL3 (GOWN DISPOSABLE) ×1 IMPLANT
GOWN STRL REUS W/TWL 2XL LVL3 (GOWN DISPOSABLE) IMPLANT
GOWN STRL REUS W/TWL LRG LVL3 (GOWN DISPOSABLE) ×4
GOWN STRL REUS W/TWL XL LVL3 (GOWN DISPOSABLE) ×2
IMPL QUARTEX 3.5X12MM (Neuro Prosthesis/Implant) ×2 IMPLANT
IMPL QUARTEX 3.5X16MM (Neuro Prosthesis/Implant) ×2 IMPLANT
IMPLANT QUARTEX 3.5X12MM (Neuro Prosthesis/Implant) ×6 IMPLANT
IMPLANT QUARTEX 3.5X16MM (Neuro Prosthesis/Implant) ×6 IMPLANT
KIT BASIN OR (CUSTOM PROCEDURE TRAY) ×3 IMPLANT
KIT INFUSE XX SMALL 0.7CC (Orthopedic Implant) ×3 IMPLANT
KIT ROOM TURNOVER OR (KITS) ×3 IMPLANT
LIQUID BAND (GAUZE/BANDAGES/DRESSINGS) ×3 IMPLANT
LOCKING CAP (Cap) ×12 IMPLANT
MARKER SKIN DUAL TIP RULER LAB (MISCELLANEOUS) ×3 IMPLANT
NEEDLE HYPO 25X1 1.5 SAFETY (NEEDLE) ×3 IMPLANT
NEEDLE SPNL 20GX3.5 QUINCKE YW (NEEDLE) ×3 IMPLANT
NS IRRIG 1000ML POUR BTL (IV SOLUTION) ×3 IMPLANT
PACK LAMINECTOMY NEURO (CUSTOM PROCEDURE TRAY) ×3 IMPLANT
PAD ARMBOARD 7.5X6 YLW CONV (MISCELLANEOUS) ×9 IMPLANT
PIN MAYFIELD SKULL DISP (PIN) ×3 IMPLANT
PUTTY BONE DBX 5CC MIX (Putty) ×3 IMPLANT
ROD SPINE POST 3.5X80 (Rod) ×3 IMPLANT
RUBBERBAND STERILE (MISCELLANEOUS) IMPLANT
SPONGE LAP 4X18 X RAY DECT (DISPOSABLE) IMPLANT
SPONGE SURGIFOAM ABS GEL 100 (HEMOSTASIS) ×3 IMPLANT
STRIP CLOSURE SKIN 1/2X4 (GAUZE/BANDAGES/DRESSINGS) ×2 IMPLANT
SUT ETHILON 4 0 PS 2 18 (SUTURE) IMPLANT
SUT VIC AB 0 CT1 18XCR BRD8 (SUTURE) ×1 IMPLANT
SUT VIC AB 0 CT1 8-18 (SUTURE) ×2
SUT VIC AB 2-0 CT1 18 (SUTURE) ×3 IMPLANT
SUT VICRYL 4-0 PS2 18IN ABS (SUTURE) ×3 IMPLANT
TAPE PAPER 2X10 WHT MICROPORE (GAUZE/BANDAGES/DRESSINGS) ×3 IMPLANT
TOWEL OR 17X24 6PK STRL BLUE (TOWEL DISPOSABLE) ×3 IMPLANT
TOWEL OR 17X26 10 PK STRL BLUE (TOWEL DISPOSABLE) ×3 IMPLANT
TRAY FOLEY W/METER SILVER 14FR (SET/KITS/TRAYS/PACK) IMPLANT
WATER STERILE IRR 1000ML POUR (IV SOLUTION) ×3 IMPLANT

## 2015-07-05 NOTE — Anesthesia Preprocedure Evaluation (Addendum)
Anesthesia Evaluation  Patient identified by MRN, date of birth, ID band Patient awake    Reviewed: Allergy & Precautions, NPO status , Patient's Chart, lab work & pertinent test results  History of Anesthesia Complications (+) PONV and history of anesthetic complications  Airway Mallampati: III  TM Distance: >3 FB Neck ROM: Full    Dental no notable dental hx. (+) Dental Advisory Given, Poor Dentition   Pulmonary neg pulmonary ROS, shortness of breath, sleep apnea and Continuous Positive Airway Pressure Ventilation , pneumonia, resolved,    Pulmonary exam normal breath sounds clear to auscultation       Cardiovascular hypertension, Pt. on medications +CHF  Normal cardiovascular exam Rhythm:Regular Rate:Normal     Neuro/Psych  Headaches, Seizures -,  PSYCHIATRIC DISORDERS Anxiety Depression    GI/Hepatic negative GI ROS, Neg liver ROS,   Endo/Other  diabetes, Well Controlled, Type 2, Oral Hypoglycemic AgentsHypothyroidism obesity  Renal/GU negative Renal ROS  negative genitourinary   Musculoskeletal  (+) Arthritis ,   Abdominal (+)  Abdomen: soft. Bowel sounds: normal.  Peds negative pediatric ROS (+)  Hematology negative hematology ROS (+)   Anesthesia Other Findings   Reproductive/Obstetrics negative OB ROS                         Anesthesia Physical Anesthesia Plan  ASA: III  Anesthesia Plan: General   Post-op Pain Management:    Induction: Intravenous  Airway Management Planned: Oral ETT  Additional Equipment:   Intra-op Plan:   Post-operative Plan: Extubation in OR  Informed Consent: I have reviewed the patients History and Physical, chart, labs and discussed the procedure including the risks, benefits and alternatives for the proposed anesthesia with the patient or authorized representative who has indicated his/her understanding and acceptance.   Dental advisory  given  Plan Discussed with: CRNA  Anesthesia Plan Comments:         Anesthesia Quick Evaluation

## 2015-07-05 NOTE — Progress Notes (Signed)
Orthopedic Tech Progress Note Patient Details:  Chelsea Bowman 02/21/1941 [id number] Patient has c-collar. Patient ID: Chelsea Bowman, female   DOB: 12/10/1940, 74 y.o.   MRN: [id number]   [name], [name] 07/05/2015, 9:00 PM

## 2015-07-05 NOTE — Progress Notes (Signed)
ANTIBIOTIC CONSULT NOTE - INITIAL  Pharmacy Consult for Vancomycin Indication: Post-op spine surgery while drain in place  Allergies  Allergen Reactions  . Dilantin [Phenytoin] Itching and Rash  . Latex Itching and Rash    Itching, burning  . Gabapentin Other (See Comments)    Makes head feel foggy  . Adhesive [Tape] Rash  . Codeine Itching and Rash    Patient Measurements: Height:  (165.1 cm) Weight: 201 lb 8 oz (91.4 kg) IBW/kg (Calculated) : 57  Vital Signs: Temp: 97 F (36.1 C) (12/19 1930) Temp Source: Oral (12/19 1210) BP: 138/72 mmHg (12/19 1915) Pulse Rate: 81 (12/19 1930) Intake/Output from previous day:   Intake/Output from this shift: Total I/O In: -  Out: 200 [Urine:200]  Labs: No results for input(s): WBC, HGB, PLT, LABCREA, CREATININE in the last 72 hours. Estimated Creatinine Clearance: 60.5 mL/min (by C-G formula based on Cr of 0.94). No results for input(s): VANCOTROUGH, VANCOPEAK, VANCORANDOM, GENTTROUGH, GENTPEAK, GENTRANDOM, TOBRATROUGH, TOBRAPEAK, TOBRARND, AMIKACINPEAK, AMIKACINTROU, AMIKACIN in the last 72 hours.   Microbiology: Recent Results (from the past 720 hour(s))  Surgical pcr screen     Status: None   Collection Time: 06/30/15  1:50 PM  Result Value Ref Range Status   MRSA, PCR NEGATIVE NEGATIVE Final   Staphylococcus aureus NEGATIVE NEGATIVE Final    Comment:        The Xpert SA Assay (FDA approved for NASAL specimens in patients over 27 years of age), is one component of a comprehensive surveillance program.  Test performance has been validated by [facility] for patients greater than or equal to 45 year old. It is not intended to diagnose infection nor to guide or monitor treatment.     Medical History: Past Medical History  Diagnosis Date  . Diabetes mellitus without complication (HCC)     takes Metformin daily  . Hyperlipidemia     takes Lovastatin daily  . Environmental allergies     takes Claritin daily   . Depression     takes Prozac daily  . Hypertension     takes Coreg daily  . Hypothyroidism     takes Synthroid daily  . PONV (postoperative nausea and vomiting)   . CHF (congestive heart failure) (HCC)     takes Furosemide daily along with Potassium  . Shortness of breath dyspnea     with exertion occasionally  . Pneumonia 11/2014  . Headache   . Seizures (HCC)     had 2 but years and years ago.Was on Phenobarbital when she was 74yrs old  . Weakness     numbness and tingling in hands and feet  . Peripheral neuropathy (HCC)   . Restless leg     takes Mirapex daily  . Chronic back pain   . Osteoporosis   . Arthritis   . Joint pain   . Joint swelling   . Hemorrhoids   . History of colon polyps     benign  . Urinary frequency   . Nocturia   . Sleep apnea      Assessment: 74 YOF who presented on 12/19 for planned posterior cervical decompression and fusion at C2-3 with a drain in place post-op. Pharmacy consulted to dose Vancomycin while the drain remains in place.   Cefazolin 2g IV x 1 dose given at 1430 earlier today, no doses of Vancomycin have been charted as given however Vancomycin powder was used intra-op.   Goal of Therapy:  Vancomycin trough level 10-15 mcg/ml  Plan:  1. Vancomycin 1g IV every 12 hours 2. Will monitor for drain removal and okay to d/c antibiotics  Georgina PillionElizabeth Lolitha Tortora, PharmD, BCPS Clinical Pharmacist Pager: [phone] 07/05/2015 7:58 PM

## 2015-07-05 NOTE — Anesthesia Postprocedure Evaluation (Signed)
Anesthesia Post Note  Patient: Chelsea AmabileLinda S Flahive  Procedure(s) Performed: Procedure(s) (LRB): Posterior Cervical Fusion with lateral mass fixation - Cervical two-three (N/A)  Patient location during evaluation: PACU Anesthesia Type: General Level of consciousness: awake Pain management: pain level controlled Vital Signs Assessment: post-procedure vital signs reviewed and stable Respiratory status: spontaneous breathing Cardiovascular status: stable Anesthetic complications: no    Last Vitals:  Filed Vitals:   07/05/15 1210 07/05/15 1709  BP: 142/64 154/68  Pulse: 64 96  Temp: 36.8 C 36.7 C  Resp: 18 22    Last Pain: There were no vitals filed for this visit.               EDWARDS,Xaviera Flaten

## 2015-07-05 NOTE — Transfer of Care (Signed)
Immediate Anesthesia Transfer of Care Note  Patient: Chelsea Bowman  Procedure(s) Performed: Procedure(s): Posterior Cervical Fusion with lateral mass fixation - Cervical two-three (N/A)  Patient Location: PACU  Anesthesia Type:General  Level of Consciousness: awake, alert , oriented and patient cooperative  Airway & Oxygen Therapy: Patient Spontanous Breathing and Patient connected to nasal cannula oxygen  Post-op Assessment: Report given to RN, Post -op Vital signs reviewed and stable, Patient moving all extremities and Patient moving all extremities X 4  Post vital signs: Reviewed and stable  Last Vitals:  Filed Vitals:   07/05/15 1210  BP: 142/64  Pulse: 64  Temp: 36.8 C  Resp: 18    Complications: No apparent anesthesia complications

## 2015-07-05 NOTE — Op Note (Signed)
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 Utilizing the globus quartex screw system  Surgeon: Jillyn HiddenGary Ayo Guarino  Asst.: Barbaraann BarthelKyle Cabell  Anesthesia: Gen.  EBL: Minimal  History of present illness: Patient is a very pleasant 74 year old female is a progress worsening neck pain bilateral hand numbness tingling weakness workup revealed severe stenosis spinal cord compression at C2-3 above her previous C3-C6 cervical fusion. Due to patient progressive clinical syndrome imaging findings and failure conservative treatment I recommended decompressive cervical laminectomy and fusion at C2-3. I extensively went over the risks and benefits of the operation the patient as well as perioperative course expectations of outcome and alternatives of surgery she understands and agrees to proceed forward.  Operative procedure: Patient brought into the or was induced under general anesthesia positioned prone in pins back side of her head was shaved and back 7 neck was prepped and draped in routine sterile fashion after position of 10 mL lidocaine with epi a midline incision was made and Bovie left car was used to densities history and subperiosteal dissections care lamina of C2 and C3 expose the lateral masses of C2 and C3. Interoperative X to confirm it medication appropriate level. So the remove the spinous processes C2 began a central decompression. There was marked stenosis and severe compression of the spinal cord rate at the C2-3 interspace. So marching down the lateral gutters on each side with a 2 mm Kerrison punch I then unroofed the central stenosis decompression central canal I marched  up to virtually the superior aspect of the C2 lamina and took off of the superior one third the C3 lamina. I then marched laterally identify the takeoff of the C2 nerve root and located the pars pedicle of C2. Then utilizing an entry point in the inferior aspect of the pars and fluoroscopy I placed drilled 2 holes 16 mm deep probe down The near cortex and placed 216 mm cortex 3.5 diameter screws. Both screws excellent purchase. Then I placed 2 lateral mass screws in C3 utilizing the inferior quadrant extending out to the superior quadrant of the lateral mass bilaterally. All screws excellent purchase then I cut was irrigated the wound I aggressively decorticated the facet joint at C2-3 laid the autograft mix and BMP down posterior laterally from C2-C3 cut 2 rods anchored in place and overlaid Gelfoam and top of the dura a sprinkle vancomycin powder and closed the wound in layers with interrupted Vicryl's medical some vancomycin powder the extrafascial space and close this with interrupted Vicryl as well with a running 4 subcuticular in the skin and Dermabond Telfa and a sterile dressing were applied patient recovered in stable condition. At the end of case all needle counts sponge counts were correct.

## 2015-07-05 NOTE — H&P (Signed)
Mal AmabileLinda S Behunin is an 74 y.o. female.   Chief Complaint: Neck pain HPI: Patient very pleasant [age] year female previously undergone a C3-6 fusion is developed rest worsening pain in her neck and numbness tingling weakness in her hands workup has revealed severe cervical stenosis with cord compression from a large osteophyte posteriorly so due to failure conservative treatment imaging findings and progression of clinical syndrome I recommended posterior cervical decompression fusion with screws at C2 and C3. I've extensively gone over the risks and benefits of the procedure with her as well as perioperative course expectations of outcome and alternatives of surgery and she understands and agrees to proceed forward.  Past Medical History  Diagnosis Date  . Diabetes mellitus without complication (HCC)     takes Metformin daily  . Hyperlipidemia     takes Lovastatin daily  . Environmental allergies     takes Claritin daily  . Depression     takes Prozac daily  . Hypertension     takes Coreg daily  . Hypothyroidism     takes Synthroid daily  . PONV (postoperative nausea and vomiting)   . CHF (congestive heart failure) (HCC)     takes Furosemide daily along with Potassium  . Shortness of breath dyspnea     with exertion occasionally  . Pneumonia 11/2014  . Headache   . Seizures (HCC)     had 2 but years and years ago.Was on Phenobarbital when she was 74yrs old  . Weakness     numbness and tingling in hands and feet  . Peripheral neuropathy (HCC)   . Restless leg     takes Mirapex daily  . Chronic back pain   . Osteoporosis   . Arthritis   . Joint pain   . Joint swelling   . Hemorrhoids   . History of colon polyps     benign  . Urinary frequency   . Nocturia   . Sleep apnea     Past Surgical History  Procedure Laterality Date  . Cholecystectomy  7522yrs ago  . Cervical fusion      anterior  . Back surgery      x 4  . Colonoscopy    . Vagina surgery  675yrs ago    unsure of  name   . Lumbar puncture      History reviewed. No pertinent family history. Social History:  reports that she has never smoked. She does not have any smokeless tobacco history on file. She reports that she does not drink alcohol or use illicit drugs.  Allergies:  Allergies  Allergen Reactions  . Dilantin [Phenytoin] Itching and Rash  . Latex Itching and Rash    Itching, burning  . Gabapentin Other (See Comments)    Makes head feel foggy  . Adhesive [Tape] Rash  . Codeine Itching and Rash    Medications Prior to Admission  Medication Sig Dispense Refill  . aspirin 81 MG tablet Take 81 mg by mouth daily.    . B Complex Vitamins (B COMPLEX 100 PO) Take 1 tablet by mouth daily.    . carvedilol (COREG) 6.25 MG tablet Take 6.25 mg by mouth 2 (two) times daily with a meal.    . cholecalciferol (VITAMIN D) 1000 UNITS tablet Take 1,000 Units by mouth daily.     . diphenhydrAMINE (BENADRYL) 25 mg capsule Take 25 mg by mouth every 8 (eight) hours as needed for itching.    Marland Kitchen. FLUoxetine (PROZAC) 40 MG capsule Take  40 mg by mouth daily.    . furosemide (LASIX) 40 MG tablet Take 40 mg by mouth daily.    Marland Kitchen ibuprofen (ADVIL,MOTRIN) 200 MG tablet Take 400 mg by mouth every 8 (eight) hours as needed for mild pain or moderate pain.    Marland Kitchen levothyroxine (SYNTHROID, LEVOTHROID) 75 MCG tablet Take 75 mcg by mouth daily before breakfast.    . loratadine (CLARITIN) 10 MG tablet Take 10 mg by mouth daily.    Marland Kitchen lovastatin (MEVACOR) 40 MG tablet Take 40 mg by mouth at bedtime.    . meloxicam (MOBIC) 15 MG tablet Take 15 mg by mouth daily as needed for pain.    . metFORMIN (GLUCOPHAGE) 500 MG tablet Take 500 mg by mouth daily with breakfast.     . potassium chloride (K-DUR) 10 MEQ tablet Take 10 mEq by mouth 2 (two) times daily.     . pramipexole (MIRAPEX) 0.25 MG tablet Take 0.75 mg by mouth at bedtime.     Marland Kitchen albuterol (PROVENTIL HFA;VENTOLIN HFA) 108 (90 BASE) MCG/ACT inhaler Inhale 1-2 puffs into the lungs  every 6 (six) hours as needed for wheezing or shortness of breath.      Results for orders placed or performed during the hospital encounter of 07/05/15 (from the past 48 hour(s))  Glucose, capillary     Status: Abnormal   Collection Time: 07/05/15 12:14 PM  Result Value Ref Range   Glucose-Capillary 118 (H) 65 - 99 mg/dL   No results found.  Review of Systems  Constitutional: Negative.   HENT: Negative.   Eyes: Negative.   Respiratory: Negative.   Cardiovascular: Negative.   Gastrointestinal: Negative.   Genitourinary: Negative.   Musculoskeletal: Positive for myalgias.  Skin: Negative.   Neurological: Positive for tingling and sensory change.    Blood pressure 142/64, pulse 64, temperature 98.2 F (36.8 C), temperature source Oral, resp. rate 18, height  (1.651 m), weight 91.4 kg (201 lb 8 oz), SpO2 96 %. Physical Exam  Constitutional: She is oriented to person, place, and time. She appears well-developed and well-nourished.  HENT:  Head: Normocephalic.  Eyes: Pupils are equal, round, and reactive to light.  Neck: Normal range of motion.  Respiratory: Effort normal.  GI: Soft.  Musculoskeletal: Normal range of motion.  Neurological: She is alert and oriented to person, place, and time. She has normal strength. GCS eye subscore is 4. GCS verbal subscore is 5. GCS motor subscore is 6.  Strength is 5 out of 5 in her deltoid, bicep, wrist flexion, wrist extension, hand intrinsics.  Skin: Skin is warm and dry.     Assessment/Plan 74 year old female presents for posterior cervical decompression and fusion at C2-3  Jamicheal Heard P 07/05/2015, 2:01 PM

## 2015-07-06 ENCOUNTER — Encounter (HOSPITAL_COMMUNITY): Payer: Self-pay | Admitting: Neurosurgery

## 2015-07-06 LAB — GLUCOSE, CAPILLARY
GLUCOSE-CAPILLARY: 148 mg/dL — AB (ref 65–99)
Glucose-Capillary: 128 mg/dL — ABNORMAL HIGH (ref 65–99)

## 2015-07-06 MED ORDER — OXYCODONE-ACETAMINOPHEN 5-325 MG PO TABS: 1.0000 | ORAL_TABLET | ORAL | Status: DC | PRN

## 2015-07-06 NOTE — Evaluation (Signed)
Physical Therapy Evaluation Patient Details Name: Chelsea Bowman Handley MRN: [id number] DOB: 07/21/1940 Today'Bowman Date: 07/06/2015   History of Present Illness  74 yo female Bowman/p C2-3 posterior lateral arthrodesis PMH: CHF, HTN, Dm OSA, remote hx of sz obese  Clinical Impression  Pt admitted with above diagnosis. Pt currently with functional limitations due to the deficits listed below (see PT Problem List). At the time of PT eval pt was able to perform transfers and ambulation with close guard for safety. Pt will need stair training prior to d/c as she was too fatigued to attempt this session. Pt will benefit from skilled PT to increase their independence and safety with mobility to allow discharge to the venue listed below.       Follow Up Recommendations Outpatient PT    Equipment Recommendations  None recommended by PT    Recommendations for Other Services       Precautions / Restrictions Precautions Precautions: Cervical;Fall Precaution Comments: Pt was educated on cervical precautions and handout was reviewed. Required Braces or Orthoses: Cervical Brace Cervical Brace: Hard collar;At all times Restrictions Weight Bearing Restrictions: No      Mobility  Bed Mobility Overal bed mobility: Needs Assistance Bed Mobility: Rolling;Sidelying to Sit Rolling: Min guard Sidelying to sit: Min guard       General bed mobility comments: VC'Bowman for proper log roll technique. Hands-on guarding for assist.   Transfers Overall transfer level: Needs assistance Equipment used: Straight cane;Rolling walker (2 wheeled) Transfers: Sit to/from Stand Sit to Stand: Supervision;Min guard         General transfer comment: Min guard with SPC, supervision with walker.   Ambulation/Gait Ambulation/Gait assistance: Min guard;Supervision Ambulation Distance (Feet): 300 Feet Assistive device: Rolling walker (2 wheeled);Straight cane Gait Pattern/deviations: Step-through pattern;Decreased stride  length;Trunk flexed Gait velocity: Decreased Gait velocity interpretation: Below normal speed for age/gender General Gait Details: Initially min guard progressing to supervision. Pt with straining of the R shoulder[facility]neck [facility] use, and RW was provided. Pt reports increased comfort with RW use.   Stairs Stairs:  (Pt declined due to fatigue. )          Wheelchair Mobility    Modified Rankin (Stroke Patients Only)       Balance Overall balance assessment: Needs assistance Sitting-balance support: Feet supported;No upper extremity supported Sitting balance-Leahy Scale: Fair     Standing balance support: No upper extremity supported Standing balance-Leahy Scale: Poor Standing balance comment: Requires at least 1 UE support                             Pertinent Vitals/Pain Pain Assessment: Faces Faces Pain Scale: Hurts a little bit Pain Location: Neck Pain Descriptors / Indicators: Operative site guarding;Discomfort Pain Intervention(Bowman): Limited activity within patient'Bowman tolerance;Monitored during session;Repositioned    Home Living Family/patient expects to be discharged to:: Private residence Living Arrangements: Children Available Help at Discharge: Family;Available 24 hours/day Type of Home: House Home Access: Stairs to enter   Enter[employer] CorporationEntrance Stairs-Number of Steps: 3-5 depending on which entrance Home Layout: One level Home Equipment: Walker - 2 wheels;Cane - single point;Shower seat;Wheelchair - manual (lift chair) Additional Comments: will have daughter (A) at home    Prior Function Level of Independence: Independent               Hand Dominance   Dominant Hand: Right    Extremity/Trunk Assessment   Upper Extremity Assessment: Defer to OT evaluation  Lower Extremity Assessment: Generalized weakness      Cervical / Trunk Assessment: Kyphotic  Communication   Communication: No difficulties  Cognition  Arousal/Alertness: Awake/alert Behavior During Therapy: WFL for tasks assessed/performed Overall Cognitive Status: Within Functional Limits for tasks assessed                      General Comments      Exercises        Assessment/Plan    PT Assessment Patient needs continued PT services  PT Diagnosis Difficulty walking;Acute pain   PT Problem List Decreased strength;Decreased range of motion;Decreased activity tolerance;Decreased balance;Decreased mobility;Decreased knowledge of use of DME;Decreased safety awareness;Decreased knowledge of precautions;Pain  PT Treatment Interventions DME instruction;Gait training;Stair training;Functional mobility training;Therapeutic activities;Therapeutic exercise;Neuromuscular re-education;Patient/family education   PT Goals (Current goals can be found in the Care Plan section) Acute Rehab PT Goals Patient Stated Goal: Feel better PT Goal Formulation: With patient Time For Goal Achievement: 07/13/15 Potential to Achieve Goals: Good    Frequency Min 5X/week   Barriers to discharge        Co-evaluation               End of Session Equipment Utilized During Treatment: Gait belt Activity Tolerance: Patient limited by fatigue Patient left: Other (comment) (Left with OT in hall) Nurse Communication: Mobility status         Time: 1610-9604 PT Time Calculation (min) (ACUTE ONLY): 28 min   Charges:   PT Evaluation $Initial PT Evaluation Tier I: 1 Procedure PT Treatments $Gait Training: 8-22 mins   PT G Codes:        Conni Slipper 07-20-15, 10:04 AM   Conni Slipper, PT, DPT Acute Rehabilitation Services Pager: [phone]

## 2015-07-06 NOTE — Discharge Instructions (Signed)

## 2015-07-06 NOTE — Evaluation (Signed)
Occupational Therapy Evaluation Patient Details Name: JULIENNE VOGLER MRN: [id number] DOB: Nov 22, 1940 Today's Date: 07/06/2015    History of Present Illness 74 yo female s/p C2-3 posterior lateral arthrodesis PMH: CHF, HTN, Dm OSA, remote hx of sz obese   Clinical Impression   Patient evaluated by Occupational Therapy with no further acute OT needs identified. All education has been completed and the patient has no further questions. See below for any follow-up Occupational Therapy or equipment needs. OT to sign off. Thank you for referral.      Follow Up Recommendations  No OT follow up    Equipment Recommendations  None recommended by OT    Recommendations for Other Services       Precautions / Restrictions Precautions Precautions: Cervical Precaution Comments: ccollar and handout present Required Braces or Orthoses: Cervical Brace Cervical Brace: Hard collar;At all times      Mobility Bed Mobility               General bed mobility comments: up in hall with PT on arrival  Transfers Overall transfer level: Needs assistance   Transfers: Sit to/from Stand Sit to Stand: Supervision              Balance                                            ADL Overall ADL's : Needs assistance/impaired Eating/Feeding: Modified independent   Grooming: Oral care;Wash/dry face;Standing Grooming Details (indicate cue type and reason): cues for cup use to avoid flexion of neck                         Tub/ Shower Transfer: Walk-in Public affairs consultant Details (indicate cue type and reason): educated on backward transfer into stall and safety with seat. Pt will use long handle sponge to wash feet Functional mobility during ADLs: Supervision/safety General ADL Comments: Pt will have daughter PRN at home to help      Vision     Perception     Praxis      Pertinent Vitals/Pain Pain Assessment:  (montiored  and stable)     Hand Dominance Right   Extremity/Trunk Assessment Upper Extremity Assessment Upper Extremity Assessment: Overall WFL for tasks assessed (reports decr numbness and incr sensation)   Lower Extremity Assessment Lower Extremity Assessment: Defer to PT evaluation   Cervical / Trunk Assessment Cervical / Trunk Assessment: Other exceptions (s/p surg)   Communication Communication Communication: No difficulties   Cognition Arousal/Alertness: Awake/alert Behavior During Therapy: WFL for tasks assessed/performed Overall Cognitive Status: Within Functional Limits for tasks assessed                     General Comments       Exercises       Shoulder Instructions      Home Living Family/patient expects to be discharged to:: Private residence Living Arrangements: Children Available Help at Discharge: Family;Available 24 hours/day Type of Home: House Home Access: Stairs to enter [employer] of Steps: 3-5 depending on which entrance   Home Layout: One level     Bathroom Shower/Tub: Producer, television/film/video: Standard Bathroom Accessibility: Yes   Home Equipment: Environmental consultant - 2 wheels;Cane - single point;Shower seat;Wheelchair - manual (lift chair)   Additional Comments: will have daughter (A) at home  Prior Functioning/Environment Level of Independence: Independent             OT Diagnosis: Generalized weakness;Acute pain   OT Problem List:     OT Treatment/Interventions:      OT Goals(Current goals can be found in the care plan section)    OT Frequency:     Barriers to D/C:            Co-evaluation              End of Session Equipment Utilized During Treatment: Rolling walker Nurse Communication: Mobility status;Precautions  Activity Tolerance: Patient tolerated treatment well Patient left: in chair;with call bell/phone within reach   Time: 0819-0835 OT Time Calculation (min): 16 min Charges:  OT  General Charges $OT Visit: 1 Procedure OT Evaluation $Initial OT Evaluation Tier I: 1 Procedure G-Codes:    Harolyn RutherfordJones, Ajwa Kimberley B 07/06/2015, 9:24 AM   Mateo FlowJones, Brynn   OTR/L Pager: [phone]: [phone] Office: [phone] .

## 2015-07-06 NOTE — Discharge Summary (Signed)
Physician Discharge Summary  Patient ID: Chelsea Bowman MRN: [id number] DOB/AGE: 72/10/1940 72 y.o.  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 Patient will be discharged her scheduled follow-up in approximately 1-2 weeks.  Consults: Significant Diagnostic Studies: Treatments: Posterior cervical decompressive laminectomy and fusion C2-3 Discharge Exam: Blood pressure 122/58, pulse 81, temperature 98.4 F (36.9 C), temperature source Oral, resp. rate 18, height 5\' 5"  (1.651 m), weight 91.4 kg (201 lb 8 oz), SpO2 94 %. Strength out of 5 wound clean dry and intact  Disposition: Home     Medication List    TAKE these medications        albuterol 108 (90 BASE) MCG/ACT inhaler  Commonly known as:  PROVENTIL HFA;VENTOLIN HFA  Inhale 1-2 puffs into the lungs every 6 (six) hours as needed for wheezing or shortness of breath.     aspirin 81 MG tablet  Take 81 mg by mouth daily.     B COMPLEX 100 PO  Take 1 tablet by mouth daily.     carvedilol 6.25 MG tablet  Commonly known as:  COREG  Take 6.25 mg by mouth 2 (two) times daily with a meal.     cholecalciferol 1000 UNITS tablet  Commonly known as:  VITAMIN D  Take 1,000 Units by mouth daily.     diphenhydrAMINE 25 mg capsule  Commonly known as:  BENADRYL  Take 25 mg by mouth every 8 (eight) hours as needed for itching.     FLUoxetine 40 MG capsule  Commonly known as:  PROZAC  Take 40 mg by mouth  daily.     furosemide 40 MG tablet  Commonly known as:  LASIX  Take 40 mg by mouth daily.     ibuprofen 200 MG tablet  Commonly known as:  ADVIL,MOTRIN  Take 400 mg by mouth every 8 (eight) hours as needed for mild pain or moderate pain.     levothyroxine 75 MCG tablet  Commonly known as:  SYNTHROID, LEVOTHROID  Take 75 mcg by mouth daily before breakfast.     loratadine 10 MG tablet  Commonly known as:  CLARITIN  Take 10 mg by mouth daily.     lovastatin 40 MG tablet  Commonly known as:  MEVACOR  Take 40 mg by mouth at bedtime.     meloxicam 15 MG tablet  Commonly known as:  MOBIC  Take 15 mg by mouth daily as needed for pain.     metFORMIN 500 MG tablet  Commonly known as:  GLUCOPHAGE  Take 500 mg by mouth daily with breakfast.     oxyCODONE-acetaminophen 5-325 MG tablet  Commonly known as:  PERCOCET/ROXICET  Take 1-2 tablets by mouth every 4 (four) hours as needed for moderate pain.     potassium chloride 10 MEQ tablet  Commonly known as:  K-DUR  Take 10 mEq by mouth 2 (two) times daily.     pramipexole 0.25 MG tablet  Commonly known as:  MIRAPEX  Take 0.75 mg by mouth at bedtime.  Follow-up Information    Follow up with [facility] P, MD.   Specialty:  Neurosurgery   Contact information:   [address] [place] [address] [phone]       Signed: Mariam Dollar 07/06/2015, 10:31 AM

## 2015-07-06 NOTE — Progress Notes (Signed)
Physical Therapy Treatment Patient Details Name: Chelsea Bowman MRN: [id number] DOB: 27-Feb-1941 Today's Date: 07/06/2015    History of Present Illness 74 yo female s/p C2-3 posterior lateral arthrodesis PMH: CHF, HTN, Dm OSA, remote hx of sz obese    PT Comments    Pt progressing towards physical therapy goals. Was able to perform transfers and ambulation with supervision for safety with the RW. Pt was able to negotiate stairs with min guard assist. No buckling noted. Pt and daughter anticipate d/c home this afternoon.   Follow Up Recommendations  Outpatient PT     Equipment Recommendations  None recommended by PT    Recommendations for Other Services       Precautions / Restrictions Precautions Precautions: Cervical;Fall Precaution Comments: Pt was educated on cervical precautions throughout functional mobility. Required Braces or Orthoses: Cervical Brace Cervical Brace: Hard collar;At all times Restrictions Weight Bearing Restrictions: No    Mobility  Bed Mobility Overal bed mobility: Needs Assistance Bed Mobility: Sit to Sidelying;Rolling Rolling: Supervision       Sit to sidelying: Supervision General bed mobility comments: Supervision for safety.   Transfers Overall transfer level: Needs assistance Equipment used: Rolling walker (2 wheeled) Transfers: Sit to/from Stand Sit to Stand: Supervision         General transfer comment: Supervision for safety. No assist required.   Ambulation/Gait Ambulation/Gait assistance: Supervision Ambulation Distance (Feet): 60 Feet Assistive device: Rolling walker (2 wheeled) Gait Pattern/deviations: Step-through pattern;Decreased stride length Gait velocity: Decreased Gait velocity interpretation: Below normal speed for age/gender General Gait Details: Slow but steady   Stairs Stairs: Yes Stairs assistance: Min guard Stair Management: One rail Left;Two rails;Step to pattern;Forwards Number of Stairs: 8 (5 then  3) General stair comments: Initially practiced with bilat railings then progressed to 1 rail to simulate home environment. Min guard for safety.  Wheelchair Mobility    Modified Rankin (Stroke Patients Only)       Balance Overall balance assessment: Needs assistance Sitting-balance support: Feet supported;No upper extremity supported Sitting balance-Leahy Scale: Fair     Standing balance support: No upper extremity supported Standing balance-Leahy Scale: Poor Standing balance comment: Requires UE support                    Cognition Arousal/Alertness: Awake/alert Behavior During Therapy: WFL for tasks assessed/performed Overall Cognitive Status: Within Functional Limits for tasks assessed                      Exercises      General Comments        Pertinent Vitals/Pain Pain Assessment: Faces Faces Pain Scale: Hurts a little bit Pain Location: Neck Pain Descriptors / Indicators: Operative site guarding;Discomfort Pain Intervention(s): Limited activity within patient's tolerance;Monitored during session;Repositioned    Home Living                      Prior Function            PT Goals (current goals can now be found in the care plan section) Acute Rehab PT Goals Patient Stated Goal: Feel better PT Goal Formulation: With patient Time For Goal Achievement: 07/13/15 Potential to Achieve Goals: Good Progress towards PT goals: Progressing toward goals    Frequency  Min 5X/week    PT Plan Current plan remains appropriate    Co-evaluation             End of Session Equipment Utilized During Treatment: Gait  belt Activity Tolerance: Patient limited by fatigue Patient left: in bed;with call bell/phone within reach     Time: 1425-1445 PT Time Calculation (min) (ACUTE ONLY): 20 min  Charges:  $Gait Training: 8-22 mins                    G Codes:      Conni SlipperKirkman, Elliana Bal 07/06/2015, 2:57 PM   Conni SlipperLaura Aalia Greulich, PT, DPT Acute  Rehabilitation Services Pager: [phone]

## 2015-07-06 NOTE — Progress Notes (Signed)
Utilization review completed.  

## 2015-07-06 NOTE — Progress Notes (Signed)
Patient ID: Chelsea AmabileLinda S Bowman, female   DOB: 11/28/1940, 74 y.o.   MRN: [id number] Doing well significant improvement in hand numbness tingling and pain.  Neurologically stable wound clean dry and intact  Discharge home

## 2015-07-06 NOTE — Progress Notes (Signed)
Pt. discharged home accompanied by Daughter. Prescriptions and discharge instructions given with verbalization of understanding. Incision site on neck with no s/s of infection - no swelling, redness, bleeding, and/or drainage noted. Aspen collar intact. Extra dressing supplies given for home use. Opportunity given to ask questions but no question asked. Pt. transported out of this unit in wheelchair by the nurse tech.

## 2015-07-14 ENCOUNTER — Encounter (HOSPITAL_COMMUNITY): Payer: Self-pay | Admitting: Neurosurgery

## 2015-11-15 ENCOUNTER — Other Ambulatory Visit: Payer: Self-pay | Admitting: Neurosurgery

## 2015-11-15 DIAGNOSIS — M5137 Other intervertebral disc degeneration, lumbosacral region: Secondary | ICD-10-CM

## 2015-11-18 ENCOUNTER — Ambulatory Visit (INDEPENDENT_AMBULATORY_CARE_PROVIDER_SITE_OTHER): Payer: Medicare HMO

## 2015-11-18 DIAGNOSIS — M4606 Spinal enthesopathy, lumbar region: Secondary | ICD-10-CM | POA: Diagnosis not present

## 2015-11-18 DIAGNOSIS — M5137 Other intervertebral disc degeneration, lumbosacral region: Secondary | ICD-10-CM

## 2015-11-18 DIAGNOSIS — M5127 Other intervertebral disc displacement, lumbosacral region: Secondary | ICD-10-CM

## 2015-11-18 DIAGNOSIS — I700 Atherosclerosis of aorta: Secondary | ICD-10-CM | POA: Diagnosis not present

## 2015-12-31 ENCOUNTER — Other Ambulatory Visit: Payer: Self-pay | Admitting: Neurosurgery

## 2016-01-05 ENCOUNTER — Encounter (HOSPITAL_COMMUNITY)
Admission: RE | Admit: 2016-01-05 | Discharge: 2016-01-05 | Disposition: A | Discharge status: Home / Self Care | Payer: Medicare HMO | Source: Ambulatory Visit | Attending: Neurosurgery | Admitting: Neurosurgery

## 2016-01-05 ENCOUNTER — Encounter (HOSPITAL_COMMUNITY): Payer: Self-pay

## 2016-01-05 DIAGNOSIS — M960 Pseudarthrosis after fusion or arthrodesis: Secondary | ICD-10-CM | POA: Diagnosis not present

## 2016-01-05 DIAGNOSIS — Z0183 Encounter for blood typing: Secondary | ICD-10-CM | POA: Insufficient documentation

## 2016-01-05 DIAGNOSIS — Z01812 Encounter for preprocedural laboratory examination: Secondary | ICD-10-CM | POA: Insufficient documentation

## 2016-01-05 HISTORY — DX: Gastro-esophageal reflux disease without esophagitis: K21.9

## 2016-01-05 LAB — CBC
HEMATOCRIT: 44.7 % (ref 36.0–46.0)
HEMOGLOBIN: 14.5 g/dL (ref 12.0–15.0)
MCH: 30.2 pg (ref 26.0–34.0)
MCHC: 32.4 g/dL (ref 30.0–36.0)
MCV: 93.1 fL (ref 78.0–100.0)
Platelets: 266 10*3/uL (ref 150–400)
RBC: 4.8 MIL/uL (ref 3.87–5.11)
RDW: 12.6 % (ref 11.5–15.5)
WBC: 7 10*3/uL (ref 4.0–10.5)

## 2016-01-05 LAB — BASIC METABOLIC PANEL
ANION GAP: 9 (ref 5–15)
BUN: 28 mg/dL — ABNORMAL HIGH (ref 6–20)
CALCIUM: 10.3 mg/dL (ref 8.9–10.3)
CO2: 30 mmol/L (ref 22–32)
Chloride: 98 mmol/L — ABNORMAL LOW (ref 101–111)
Creatinine, Ser: 1.02 mg/dL — ABNORMAL HIGH (ref 0.44–1.00)
GFR, EST NON AFRICAN AMERICAN: 53 mL/min — AB (ref >60)
GLUCOSE: 102 mg/dL — AB (ref 65–99)
POTASSIUM: 4.1 mmol/L (ref 3.5–5.1)
SODIUM: 137 mmol/L (ref 135–145)

## 2016-01-05 LAB — TYPE AND SCREEN
ABO/RH(D): A POS
ANTIBODY SCREEN: NEGATIVE

## 2016-01-05 LAB — SURGICAL PCR SCREEN
MRSA, PCR: NEGATIVE
STAPHYLOCOCCUS AUREUS: NEGATIVE

## 2016-01-05 LAB — GLUCOSE, CAPILLARY: GLUCOSE-CAPILLARY: 95 mg/dL (ref 65–99)

## 2016-01-05 NOTE — Pre-Procedure Instructions (Signed)
Chelsea Bowman  01/05/2016      [id number] - [place], [place] - [address] [place] [address] Phone: [phone] Fax: [phone]  [facility] [place], [place] - [address] [place] [address] Phone: [phone] Fax: [phone]    Your procedure is scheduled on Wed, June 28 @ 12:55 PM  Report to [facility] Admitting at 10:45 AM  Call this number if you have problems the morning of surgery:  [phone]   Remember:  Do not eat food or drink liquids after midnight.  Take these medicines the morning of surgery with A SIP OF WATER Albuterol<Bring Your Inhaler With You>,Carvedilol(Coreg),Prozac(Fluoxetine),Synthroid(Levothyroxine),and Claritin(Loratadine)              Stop taking your Mobic,Aspirin,and Ibuprofen a week prior to surgery. No Goody's,BC's,Aleve,Advil,Motrin,Fish Oil,or any Herbal Medications.     How to Manage Your Diabetes Before and After Surgery  Why is it important to control my blood sugar before and after surgery? . Improving blood sugar levels before and after surgery helps healing and can limit problems. . A way of improving blood sugar control is eating a healthy diet by: o  Eating less sugar and carbohydrates o  Increasing activity/exercise o  Talking with your doctor about reaching your blood sugar goals . High blood sugars (greater than 180 mg/dL) can raise your risk of infections and slow your recovery, so you will need to focus on controlling your diabetes during the weeks before surgery. . Make sure that the doctor who takes care of your diabetes knows about your planned surgery including the date and location.  How do I manage my blood sugar before surgery? . Check your blood sugar at least 4 times a day, starting 2 days before surgery, to make sure that the level is not too high or low. o Check your blood sugar the morning of your surgery when  you wake up and every 2 hours until you get to the Short Stay unit. . If your blood sugar is less than 70 mg/dL, you will need to treat for low blood sugar: o Do not take insulin. o Treat a low blood sugar (less than 70 mg/dL) with  cup of clear juice (cranberry or apple), 4 glucose tablets, OR glucose gel. o Recheck blood sugar in 15 minutes after treatment (to make sure it is greater than 70 mg/dL). If your blood sugar is not greater than 70 mg/dL on recheck, call [phone] for further instructions. . Report your blood sugar to the short stay nurse when you get to Short Stay.  . If you are admitted to the hospital after surgery: o Your blood sugar will be checked by the staff and you will probably be given insulin after surgery (instead of oral diabetes medicines) to make sure you have good blood sugar levels. o The goal for blood sugar control after surgery is 80-180 mg/dL.              WHAT DO I DO ABOUT MY DIABETES MEDICATION?   Marland Kitchen Do not take oral diabetes medicines (pills) the morning of surgery. .       .   . The day of surgery, do not take other diabetes injectables, including Byetta (exenatide), Bydureon (exenatide ER), Victoza (liraglutide), or Trulicity (dulaglutide).  . If your CBG is greater than 220 mg/dL, you may take  of your sliding scale (correction) dose of  insulin.  Other Instructions:          Patient Signature:  Date:   Nurse Signature:  Date:   Reviewed and Endorsed by [facility] Patient Education Committee, August 2015   Do not wear jewelry, make-up or nail polish.  Do not wear lotions, powders, or perfumes.    Do not shave 48 hours prior to surgery.    Do not bring valuables to the hospital.  [facility] is not responsible for any belongings or valuables.  Contacts, dentures or bridgework may not be worn into surgery.  Leave your suitcase in the car.  After surgery it may be brought to your room.  For patients admitted to the  hospital, discharge time will be determined by your treatment team.  Patients discharged the day of surgery will not be allowed to drive home.    [facility] - Preparing for Surgery  Before surgery, you can play an important role.  Because skin is not sterile, your skin needs to be as free of germs as possible.  You can reduce the number of germs on you skin by washing with CHG (chlorahexidine gluconate) soap before surgery.  CHG is an antiseptic cleaner which kills germs and bonds with the skin to continue killing germs even after washing.  Please DO NOT use if you have an allergy to CHG or antibacterial soaps.  If your skin becomes reddened/irritated stop using the CHG and inform your nurse when you arrive at Short Stay.  Do not shave (including legs and underarms) for at least 48 hours prior to the first CHG shower.  You may shave your face.  Please follow these instructions carefully:   1.  Shower with CHG Soap the night before surgery and the                                morning of Surgery.  2.  If you choose to wash your hair, wash your hair first as usual with your       normal shampoo.  3.  After you shampoo, rinse your hair and body thoroughly to remove the                      Shampoo.  4.  Use CHG as you would any other liquid soap.  You can apply chg directly       to the skin and wash gently with scrungie or a clean washcloth.  5.  Apply the CHG Soap to your body ONLY FROM THE NECK DOWN.        Do not use on open wounds or open sores.  Avoid contact with your eyes,       ears, mouth and genitals (private parts).  Wash genitals (private parts)       with your normal soap.  6.  Wash thoroughly, paying special attention to the area where your surgery        will be performed.  7.  Thoroughly rinse your body with warm water from the neck down.  8.  DO NOT shower/wash with your normal soap after using and rinsing off       the CHG Soap.  9.  Pat yourself dry with a  clean towel.            10.  Wear clean pajamas.            11.  Place clean sheets on your bed the night of your first shower and do not        sleep with pets.  Day of Surgery  Do not apply any lotions/deoderants the morning of surgery.  Please wear clean clothes to the hospital/surgery center.     Please read over the following fact sheets that you were given. Pain Booklet and Coughing and Deep Breathing

## 2016-01-05 NOTE — Progress Notes (Addendum)
Saw Cardiologist Dr.Smull 4 yrs ago,report under care everywhere  Medical Md is Dr.William Tresa EndoKelly  Echo > 4 yrs ago  Stress test denies  Heart cath 2014  EKG in epic from 07-05-15  CXR denies in past yr

## 2016-01-06 LAB — HEMOGLOBIN A1C
HEMOGLOBIN A1C: 5.8 % — AB (ref 4.8–5.6)
MEAN PLASMA GLUCOSE: 120 mg/dL

## 2016-01-11 MED ORDER — CEFAZOLIN SODIUM-DEXTROSE 2-4 GM/100ML-% IV SOLN: 2.0000 g | INTRAVENOUS | Status: DC

## 2016-01-11 MED ORDER — DEXAMETHASONE SODIUM PHOSPHATE 10 MG/ML IJ SOLN: 10.0000 mg | INTRAMUSCULAR | Status: DC

## 2016-01-12 ENCOUNTER — Inpatient Hospital Stay (HOSPITAL_COMMUNITY)
Admission: RE | Admit: 2016-01-12 | Discharge: 2016-01-16 | DRG: 460 | Disposition: A | Discharge status: Home Health | Payer: Medicare HMO | Source: Ambulatory Visit | Attending: Neurosurgery | Admitting: Neurosurgery

## 2016-01-12 ENCOUNTER — Inpatient Hospital Stay (HOSPITAL_COMMUNITY): Payer: Medicare HMO | Admitting: Certified Registered"

## 2016-01-12 ENCOUNTER — Encounter (HOSPITAL_COMMUNITY): Payer: Self-pay | Admitting: *Deleted

## 2016-01-12 ENCOUNTER — Inpatient Hospital Stay (HOSPITAL_COMMUNITY): Payer: Medicare HMO

## 2016-01-12 ENCOUNTER — Encounter (HOSPITAL_COMMUNITY)
Admission: RE | Disposition: A | Discharge status: Home Health | Payer: Self-pay | Source: Ambulatory Visit | Attending: Neurosurgery

## 2016-01-12 DIAGNOSIS — M810 Age-related osteoporosis without current pathological fracture: Secondary | ICD-10-CM | POA: Diagnosis present

## 2016-01-12 DIAGNOSIS — M532X6 Spinal instabilities, lumbar region: Secondary | ICD-10-CM | POA: Diagnosis present

## 2016-01-12 DIAGNOSIS — M4806 Spinal stenosis, lumbar region: Secondary | ICD-10-CM | POA: Diagnosis present

## 2016-01-12 DIAGNOSIS — I509 Heart failure, unspecified: Secondary | ICD-10-CM | POA: Diagnosis present

## 2016-01-12 DIAGNOSIS — M4712 Other spondylosis with myelopathy, cervical region: Secondary | ICD-10-CM

## 2016-01-12 DIAGNOSIS — G629 Polyneuropathy, unspecified: Secondary | ICD-10-CM | POA: Diagnosis present

## 2016-01-12 DIAGNOSIS — G2581 Restless legs syndrome: Secondary | ICD-10-CM | POA: Diagnosis present

## 2016-01-12 DIAGNOSIS — M960 Pseudarthrosis after fusion or arthrodesis: Secondary | ICD-10-CM | POA: Diagnosis present

## 2016-01-12 DIAGNOSIS — K219 Gastro-esophageal reflux disease without esophagitis: Secondary | ICD-10-CM | POA: Diagnosis present

## 2016-01-12 DIAGNOSIS — I110 Hypertensive heart disease with heart failure: Secondary | ICD-10-CM | POA: Diagnosis present

## 2016-01-12 DIAGNOSIS — Z79899 Other long term (current) drug therapy: Secondary | ICD-10-CM

## 2016-01-12 DIAGNOSIS — F329 Major depressive disorder, single episode, unspecified: Secondary | ICD-10-CM | POA: Diagnosis present

## 2016-01-12 DIAGNOSIS — E039 Hypothyroidism, unspecified: Secondary | ICD-10-CM | POA: Diagnosis present

## 2016-01-12 DIAGNOSIS — M5136 Other intervertebral disc degeneration, lumbar region: Secondary | ICD-10-CM | POA: Diagnosis present

## 2016-01-12 DIAGNOSIS — R52 Pain, unspecified: Secondary | ICD-10-CM

## 2016-01-12 DIAGNOSIS — S32009K Unspecified fracture of unspecified lumbar vertebra, subsequent encounter for fracture with nonunion: Secondary | ICD-10-CM | POA: Diagnosis present

## 2016-01-12 DIAGNOSIS — Z91040 Latex allergy status: Secondary | ICD-10-CM | POA: Diagnosis not present

## 2016-01-12 DIAGNOSIS — E785 Hyperlipidemia, unspecified: Secondary | ICD-10-CM | POA: Diagnosis present

## 2016-01-12 DIAGNOSIS — G4730 Sleep apnea, unspecified: Secondary | ICD-10-CM | POA: Diagnosis present

## 2016-01-12 DIAGNOSIS — S86211A Strain of muscle(s) and tendon(s) of anterior muscle group at lower leg level, right leg, initial encounter: Secondary | ICD-10-CM | POA: Diagnosis present

## 2016-01-12 DIAGNOSIS — Z888 Allergy status to other drugs, medicaments and biological substances status: Secondary | ICD-10-CM

## 2016-01-12 DIAGNOSIS — E1142 Type 2 diabetes mellitus with diabetic polyneuropathy: Secondary | ICD-10-CM | POA: Diagnosis present

## 2016-01-12 DIAGNOSIS — M5412 Radiculopathy, cervical region: Secondary | ICD-10-CM

## 2016-01-12 DIAGNOSIS — Z7984 Long term (current) use of oral hypoglycemic drugs: Secondary | ICD-10-CM

## 2016-01-12 DIAGNOSIS — Z419 Encounter for procedure for purposes other than remedying health state, unspecified: Secondary | ICD-10-CM

## 2016-01-12 DIAGNOSIS — Z885 Allergy status to narcotic agent status: Secondary | ICD-10-CM | POA: Diagnosis not present

## 2016-01-12 DIAGNOSIS — M549 Dorsalgia, unspecified: Secondary | ICD-10-CM | POA: Diagnosis present

## 2016-01-12 HISTORY — PX: POSTERIOR LUMBAR FUSION: SHX6036

## 2016-01-12 LAB — GLUCOSE, CAPILLARY
GLUCOSE-CAPILLARY: 99 mg/dL (ref 65–99)
GLUCOSE-CAPILLARY: 128 mg/dL — AB (ref 65–99)

## 2016-01-12 SURGERY — POSTERIOR LUMBAR FUSION 1 WITH HARDWARE REMOVAL
Anesthesia: General | Site: Back

## 2016-01-12 MED ORDER — SUGAMMADEX SODIUM 200 MG/2ML IV SOLN
INTRAVENOUS | Status: DC | PRN
  Administered 2016-01-12: 200 mg via INTRAVENOUS

## 2016-01-12 MED ORDER — SUFENTANIL CITRATE 50 MCG/ML IV SOLN
INTRAVENOUS | Status: DC | PRN
  Administered 2016-01-12: 20 ug via INTRAVENOUS
  Administered 2016-01-12: 5 ug via INTRAVENOUS

## 2016-01-12 MED ORDER — SUGAMMADEX SODIUM 200 MG/2ML IV SOLN
INTRAVENOUS | Status: AC
  Filled 2016-01-12: qty 2

## 2016-01-12 MED ORDER — OXYCODONE-ACETAMINOPHEN 5-325 MG PO TABS
ORAL_TABLET | ORAL | Status: AC
  Administered 2016-01-12: 2 via ORAL
  Filled 2016-01-12: qty 2

## 2016-01-12 MED ORDER — FENTANYL CITRATE (PF) 100 MCG/2ML IJ SOLN
25.0000 ug | INTRAMUSCULAR | Status: DC | PRN
  Administered 2016-01-12 (×3): 50 ug via INTRAVENOUS

## 2016-01-12 MED ORDER — HYDROMORPHONE HCL 1 MG/ML IJ SOLN
0.5000 mg | INTRAMUSCULAR | Status: DC | PRN
  Administered 2016-01-12: 1 mg via INTRAVENOUS
  Filled 2016-01-12: qty 1

## 2016-01-12 MED ORDER — FENTANYL CITRATE (PF) 100 MCG/2ML IJ SOLN
INTRAMUSCULAR | Status: AC
  Filled 2016-01-12: qty 2

## 2016-01-12 MED ORDER — ONDANSETRON HCL 4 MG/2ML IJ SOLN: 4.0000 mg | INTRAMUSCULAR | Status: DC | PRN

## 2016-01-12 MED ORDER — SODIUM CHLORIDE 0.9% FLUSH: 3.0000 mL | INTRAVENOUS | Status: DC | PRN

## 2016-01-12 MED ORDER — LIDOCAINE 2% (20 MG/ML) 5 ML SYRINGE
INTRAMUSCULAR | Status: DC | PRN
  Administered 2016-01-12: 100 mg via INTRAVENOUS

## 2016-01-12 MED ORDER — SODIUM CHLORIDE 0.9% FLUSH
3.0000 mL | Freq: Two times a day (BID) | INTRAVENOUS | Status: DC
  Administered 2016-01-12 – 2016-01-14 (×4): 3 mL via INTRAVENOUS
  Administered 2016-01-15: 10 mL via INTRAVENOUS
  Administered 2016-01-15 – 2016-01-16 (×2): 3 mL via INTRAVENOUS

## 2016-01-12 MED ORDER — SUFENTANIL CITRATE 50 MCG/ML IV SOLN
INTRAVENOUS | Status: AC
  Filled 2016-01-12: qty 1

## 2016-01-12 MED ORDER — ACETAMINOPHEN 325 MG PO TABS: 650.0000 mg | ORAL_TABLET | ORAL | Status: DC | PRN

## 2016-01-12 MED ORDER — LIDOCAINE 2% (20 MG/ML) 5 ML SYRINGE
INTRAMUSCULAR | Status: AC
  Filled 2016-01-12: qty 5

## 2016-01-12 MED ORDER — BUPIVACAINE LIPOSOME 1.3 % IJ SUSP
20.0000 mL | INTRAMUSCULAR | Status: DC
  Filled 2016-01-12: qty 20

## 2016-01-12 MED ORDER — EPHEDRINE SULFATE 50 MG/ML IJ SOLN
INTRAMUSCULAR | Status: DC | PRN
  Administered 2016-01-12: 15 mg via INTRAVENOUS
  Administered 2016-01-12: 10 mg via INTRAVENOUS
  Administered 2016-01-12: 15 mg via INTRAVENOUS
  Administered 2016-01-12: 10 mg via INTRAVENOUS

## 2016-01-12 MED ORDER — ROCURONIUM BROMIDE 100 MG/10ML IV SOLN
INTRAVENOUS | Status: DC | PRN
  Administered 2016-01-12: 20 mg via INTRAVENOUS
  Administered 2016-01-12: 10 mg via INTRAVENOUS
  Administered 2016-01-12: 50 mg via INTRAVENOUS
  Administered 2016-01-12: 10 mg via INTRAVENOUS

## 2016-01-12 MED ORDER — PHENOL 1.4 % MT LIQD
1.0000 | OROMUCOSAL | Status: DC | PRN
Start: 2016-01-12 — End: 2016-01-16

## 2016-01-12 MED ORDER — ALBUTEROL SULFATE (2.5 MG/3ML) 0.083% IN NEBU: 2.5000 mg | INHALATION_SOLUTION | Freq: Four times a day (QID) | RESPIRATORY_TRACT | Status: DC | PRN

## 2016-01-12 MED ORDER — DIPHENHYDRAMINE HCL 50 MG/ML IJ SOLN
INTRAMUSCULAR | Status: AC
  Filled 2016-01-12: qty 1

## 2016-01-12 MED ORDER — 0.9 % SODIUM CHLORIDE (POUR BTL) OPTIME
TOPICAL | Status: DC | PRN
  Administered 2016-01-12: 1000 mL

## 2016-01-12 MED ORDER — DOCUSATE SODIUM 100 MG PO CAPS
100.0000 mg | ORAL_CAPSULE | Freq: Two times a day (BID) | ORAL | Status: DC
  Administered 2016-01-12 – 2016-01-16 (×8): 100 mg via ORAL
  Filled 2016-01-12 (×8): qty 1

## 2016-01-12 MED ORDER — DEXAMETHASONE SODIUM PHOSPHATE 10 MG/ML IJ SOLN
INTRAMUSCULAR | Status: AC
  Administered 2016-01-12: 10 mg via INTRAVENOUS
  Filled 2016-01-12: qty 1

## 2016-01-12 MED ORDER — ONDANSETRON HCL 4 MG/2ML IJ SOLN
INTRAMUSCULAR | Status: DC | PRN
  Administered 2016-01-12: 4 mg via INTRAVENOUS

## 2016-01-12 MED ORDER — VITAMIN D 1000 UNITS PO TABS
1000.0000 [IU] | ORAL_TABLET | Freq: Every day | ORAL | Status: DC
  Administered 2016-01-13 – 2016-01-16 (×4): 1000 [IU] via ORAL
  Filled 2016-01-12 (×4): qty 1

## 2016-01-12 MED ORDER — CYCLOBENZAPRINE HCL 10 MG PO TABS
ORAL_TABLET | ORAL | Status: AC
  Administered 2016-01-12: 10 mg via ORAL
  Filled 2016-01-12: qty 1

## 2016-01-12 MED ORDER — ACETAMINOPHEN 325 MG PO TABS: 650.0000 mg | ORAL_TABLET | Freq: Four times a day (QID) | ORAL | Status: DC | PRN

## 2016-01-12 MED ORDER — PHENYLEPHRINE HCL 10 MG/ML IJ SOLN
10.0000 mg | INTRAVENOUS | Status: DC | PRN
  Administered 2016-01-12: 40 ug/min via INTRAVENOUS

## 2016-01-12 MED ORDER — PROPOFOL 10 MG/ML IV BOLUS
INTRAVENOUS | Status: DC | PRN
  Administered 2016-01-12: 100 mg via INTRAVENOUS

## 2016-01-12 MED ORDER — PROPOFOL 10 MG/ML IV BOLUS
INTRAVENOUS | Status: AC
  Filled 2016-01-12: qty 20

## 2016-01-12 MED ORDER — MENTHOL 3 MG MT LOZG: 1.0000 | LOZENGE | OROMUCOSAL | Status: DC | PRN

## 2016-01-12 MED ORDER — METFORMIN HCL 500 MG PO TABS
500.0000 mg | ORAL_TABLET | Freq: Every day | ORAL | Status: DC
  Administered 2016-01-13 – 2016-01-16 (×4): 500 mg via ORAL
  Filled 2016-01-12 (×4): qty 1

## 2016-01-12 MED ORDER — DIPHENHYDRAMINE HCL 50 MG/ML IJ SOLN
INTRAMUSCULAR | Status: DC | PRN
  Administered 2016-01-12: 10 mg via INTRAVENOUS

## 2016-01-12 MED ORDER — PRAVASTATIN SODIUM 20 MG PO TABS
10.0000 mg | ORAL_TABLET | Freq: Every day | ORAL | Status: DC
  Administered 2016-01-12 – 2016-01-15 (×4): 10 mg via ORAL
  Filled 2016-01-12 (×4): qty 1

## 2016-01-12 MED ORDER — DIPHENHYDRAMINE HCL 25 MG PO CAPS
25.0000 mg | ORAL_CAPSULE | Freq: Three times a day (TID) | ORAL | Status: DC | PRN
  Administered 2016-01-13 – 2016-01-14 (×2): 25 mg via ORAL
  Filled 2016-01-12 (×2): qty 1

## 2016-01-12 MED ORDER — CARVEDILOL 6.25 MG PO TABS
6.2500 mg | ORAL_TABLET | Freq: Two times a day (BID) | ORAL | Status: DC
  Administered 2016-01-12 – 2016-01-16 (×8): 6.25 mg via ORAL
  Filled 2016-01-12 (×8): qty 1

## 2016-01-12 MED ORDER — VANCOMYCIN HCL 1000 MG IV SOLR
INTRAVENOUS | Status: AC
  Filled 2016-01-12: qty 1000

## 2016-01-12 MED ORDER — ROCURONIUM BROMIDE 50 MG/5ML IV SOLN
INTRAVENOUS | Status: AC
  Filled 2016-01-12: qty 1

## 2016-01-12 MED ORDER — SODIUM CHLORIDE 0.9 % IR SOLN
Status: DC | PRN
  Administered 2016-01-12: 13:00:00

## 2016-01-12 MED ORDER — FENTANYL CITRATE (PF) 100 MCG/2ML IJ SOLN
INTRAMUSCULAR | Status: AC
  Administered 2016-01-12: 50 ug via INTRAVENOUS
  Filled 2016-01-12: qty 2

## 2016-01-12 MED ORDER — SODIUM CHLORIDE 0.9 % IV SOLN
250.0000 mL | INTRAVENOUS | Status: DC
  Administered 2016-01-12: 250 mL via INTRAVENOUS

## 2016-01-12 MED ORDER — EPHEDRINE 5 MG/ML INJ
INTRAVENOUS | Status: AC
  Filled 2016-01-12: qty 10

## 2016-01-12 MED ORDER — HYDROCORTISONE ACETATE 25 MG RE SUPP
25.0000 mg | Freq: Two times a day (BID) | RECTAL | Status: DC
  Administered 2016-01-13 – 2016-01-16 (×6): 25 mg via RECTAL
  Filled 2016-01-12 (×9): qty 1

## 2016-01-12 MED ORDER — ACETAMINOPHEN 650 MG RE SUPP: 650.0000 mg | RECTAL | Status: DC | PRN

## 2016-01-12 MED ORDER — MIDAZOLAM HCL 2 MG/2ML IJ SOLN
INTRAMUSCULAR | Status: AC
  Filled 2016-01-12: qty 2

## 2016-01-12 MED ORDER — LACTATED RINGERS IV SOLN
INTRAVENOUS | Status: DC
  Administered 2016-01-12 (×2): via INTRAVENOUS

## 2016-01-12 MED ORDER — LORATADINE 10 MG PO TABS
10.0000 mg | ORAL_TABLET | Freq: Every day | ORAL | Status: DC
  Administered 2016-01-13 – 2016-01-16 (×4): 10 mg via ORAL
  Filled 2016-01-12 (×4): qty 1

## 2016-01-12 MED ORDER — BUPIVACAINE LIPOSOME 1.3 % IJ SUSP
INTRAMUSCULAR | Status: DC | PRN
Start: 2016-01-12 — End: 2016-01-12
  Administered 2016-01-12: 20 mL

## 2016-01-12 MED ORDER — LEVOTHYROXINE SODIUM 75 MCG PO TABS
75.0000 ug | ORAL_TABLET | Freq: Every day | ORAL | Status: DC
  Administered 2016-01-13 – 2016-01-16 (×4): 75 ug via ORAL
  Filled 2016-01-12 (×4): qty 1

## 2016-01-12 MED ORDER — POTASSIUM CHLORIDE CRYS ER 10 MEQ PO TBCR
10.0000 meq | EXTENDED_RELEASE_TABLET | Freq: Two times a day (BID) | ORAL | Status: DC
  Administered 2016-01-12 – 2016-01-16 (×8): 10 meq via ORAL
  Filled 2016-01-12 (×8): qty 1

## 2016-01-12 MED ORDER — LIDOCAINE-EPINEPHRINE 1 %-1:100000 IJ SOLN
INTRAMUSCULAR | Status: DC | PRN
  Administered 2016-01-12: 10 mL

## 2016-01-12 MED ORDER — OXYCODONE-ACETAMINOPHEN 5-325 MG PO TABS
1.0000 | ORAL_TABLET | ORAL | Status: DC | PRN
  Administered 2016-01-12 – 2016-01-15 (×6): 2 via ORAL
  Administered 2016-01-15: 1 via ORAL
  Administered 2016-01-16 (×2): 2 via ORAL
  Filled 2016-01-12 (×7): qty 2
  Filled 2016-01-12: qty 1

## 2016-01-12 MED ORDER — ONDANSETRON HCL 4 MG/2ML IJ SOLN
INTRAMUSCULAR | Status: AC
  Filled 2016-01-12: qty 2

## 2016-01-12 MED ORDER — CYCLOBENZAPRINE HCL 10 MG PO TABS
10.0000 mg | ORAL_TABLET | Freq: Three times a day (TID) | ORAL | Status: DC | PRN
  Administered 2016-01-12 – 2016-01-15 (×5): 10 mg via ORAL
  Filled 2016-01-12 (×4): qty 1

## 2016-01-12 MED ORDER — THROMBIN 20000 UNITS EX SOLR
CUTANEOUS | Status: DC | PRN
  Administered 2016-01-12: 13:00:00 via TOPICAL

## 2016-01-12 MED ORDER — ONDANSETRON HCL 4 MG/2ML IJ SOLN: 4.0000 mg | Freq: Once | INTRAMUSCULAR | Status: DC | PRN

## 2016-01-12 MED ORDER — CEFAZOLIN SODIUM-DEXTROSE 2-4 GM/100ML-% IV SOLN
INTRAVENOUS | Status: AC
  Administered 2016-01-12: 2 g via INTRAVENOUS
  Filled 2016-01-12: qty 100

## 2016-01-12 MED ORDER — CEFAZOLIN SODIUM-DEXTROSE 2-4 GM/100ML-% IV SOLN
2.0000 g | Freq: Three times a day (TID) | INTRAVENOUS | Status: AC
  Administered 2016-01-12 – 2016-01-14 (×6): 2 g via INTRAVENOUS
  Filled 2016-01-12 (×6): qty 100

## 2016-01-12 MED ORDER — ACETAMINOPHEN 10 MG/ML IV SOLN
INTRAVENOUS | Status: AC
  Administered 2016-01-12: 1000 mg via INTRAVENOUS
  Filled 2016-01-12: qty 100

## 2016-01-12 MED ORDER — ASPIRIN 81 MG PO CHEW
81.0000 mg | CHEWABLE_TABLET | Freq: Every day | ORAL | Status: DC
  Administered 2016-01-13 – 2016-01-16 (×4): 81 mg via ORAL
  Filled 2016-01-12 (×4): qty 1

## 2016-01-12 MED ORDER — FUROSEMIDE 40 MG PO TABS
40.0000 mg | ORAL_TABLET | Freq: Every day | ORAL | Status: DC
  Administered 2016-01-13 – 2016-01-16 (×4): 40 mg via ORAL
  Filled 2016-01-12 (×4): qty 1

## 2016-01-12 MED ORDER — VANCOMYCIN HCL 1000 MG IV SOLR
INTRAVENOUS | Status: DC | PRN
  Administered 2016-01-12: 1000 mg via TOPICAL

## 2016-01-12 MED ORDER — MIDAZOLAM HCL 5 MG/5ML IJ SOLN
INTRAMUSCULAR | Status: DC | PRN
  Administered 2016-01-12 (×2): 1 mg via INTRAVENOUS

## 2016-01-12 MED ORDER — PRAMIPEXOLE DIHYDROCHLORIDE 0.125 MG PO TABS
0.7500 mg | ORAL_TABLET | Freq: Every day | ORAL | Status: DC
  Administered 2016-01-12 – 2016-01-15 (×4): 0.75 mg via ORAL
  Filled 2016-01-12 (×4): qty 6

## 2016-01-12 MED ORDER — FLUOXETINE HCL 20 MG PO CAPS
40.0000 mg | ORAL_CAPSULE | Freq: Every day | ORAL | Status: DC
  Administered 2016-01-13 – 2016-01-16 (×4): 40 mg via ORAL
  Filled 2016-01-12 (×4): qty 2

## 2016-01-12 SURGICAL SUPPLY — 86 items
BAG DECANTER FOR FLEXI CONT (MISCELLANEOUS) ×3 IMPLANT
BENZOIN TINCTURE PRP APPL 2/3 (GAUZE/BANDAGES/DRESSINGS) ×3 IMPLANT
BLADE CLIPPER SURG (BLADE) IMPLANT
BLADE SURG 11 STRL SS (BLADE) ×3 IMPLANT
BRUSH SCRUB EZ PLAIN DRY (MISCELLANEOUS) ×3 IMPLANT
BUR MATCHSTICK NEURO 3.0 LAGG (BURR) ×3 IMPLANT
BUR PRECISION FLUTE 6.0 (BURR) ×3 IMPLANT
CANISTER SUCT 3000ML PPV (MISCELLANEOUS) ×3 IMPLANT
CAP LOCKING THREADED (Cap) ×12 IMPLANT
CLOSURE WOUND 1/2 X4 (GAUZE/BANDAGES/DRESSINGS) ×1
CONT SPEC 4OZ CLIKSEAL STRL BL (MISCELLANEOUS) ×6 IMPLANT
COVER BACK TABLE 24X17X13 BIG (DRAPES) IMPLANT
COVER BACK TABLE 60X90IN (DRAPES) ×3 IMPLANT
CROSSLINK SPINAL FUSION (Cage) ×3 IMPLANT
DECANTER SPIKE VIAL GLASS SM (MISCELLANEOUS) ×3 IMPLANT
DERMABOND ADVANCED (GAUZE/BANDAGES/DRESSINGS) ×2
DERMABOND ADVANCED .7 DNX12 (GAUZE/BANDAGES/DRESSINGS) ×1 IMPLANT
DRAPE C-ARM 42X72 X-RAY (DRAPES) ×3 IMPLANT
DRAPE C-ARMOR (DRAPES) IMPLANT
DRAPE LAPAROTOMY 100X72X124 (DRAPES) ×3 IMPLANT
DRAPE POUCH INSTRU U-SHP 10X18 (DRAPES) ×3 IMPLANT
DRAPE PROXIMA HALF (DRAPES) IMPLANT
DRAPE SURG 17X23 STRL (DRAPES) ×3 IMPLANT
DRSG OPSITE 4X5.5 SM (GAUZE/BANDAGES/DRESSINGS) ×3 IMPLANT
DRSG OPSITE POSTOP 4X10 (GAUZE/BANDAGES/DRESSINGS) ×3 IMPLANT
DURAPREP 26ML APPLICATOR (WOUND CARE) ×3 IMPLANT
ELECT BLADE 4.0 EZ CLEAN MEGAD (MISCELLANEOUS) ×3
ELECT REM PT RETURN 9FT ADLT (ELECTROSURGICAL) ×3
ELECTRODE BLDE 4.0 EZ CLN MEGD (MISCELLANEOUS) ×1 IMPLANT
ELECTRODE REM PT RTRN 9FT ADLT (ELECTROSURGICAL) ×1 IMPLANT
EVACUATOR 3/16  PVC DRAIN (DRAIN) ×2
EVACUATOR 3/16 PVC DRAIN (DRAIN) ×1 IMPLANT
GAUZE SPONGE 4X4 12PLY STRL (GAUZE/BANDAGES/DRESSINGS) ×3 IMPLANT
GAUZE SPONGE 4X4 16PLY XRAY LF (GAUZE/BANDAGES/DRESSINGS) IMPLANT
GLOVE BIO SURGEON STRL SZ8 (GLOVE) ×6 IMPLANT
GLOVE BIOGEL PI IND STRL 7.5 (GLOVE) ×1 IMPLANT
GLOVE BIOGEL PI INDICATOR 7.5 (GLOVE) ×2
GLOVE ECLIPSE 7.5 STRL STRAW (GLOVE) IMPLANT
GLOVE EXAM NITRILE LRG STRL (GLOVE) IMPLANT
GLOVE EXAM NITRILE MD LF STRL (GLOVE) IMPLANT
GLOVE EXAM NITRILE XL STR (GLOVE) IMPLANT
GLOVE EXAM NITRILE XS STR PU (GLOVE) IMPLANT
GLOVE INDICATOR 6.5 STRL GRN (GLOVE) ×6 IMPLANT
GLOVE INDICATOR 8.5 STRL (GLOVE) ×6 IMPLANT
GLOVE SURG SS PI 6.5 STRL IVOR (GLOVE) ×9 IMPLANT
GLOVE SURG SS PI 7.0 STRL IVOR (GLOVE) ×9 IMPLANT
GLOVE SURG SS PI 7.5 STRL IVOR (GLOVE) ×12 IMPLANT
GLOVE SURG SS PI 8.0 STRL IVOR (GLOVE) ×6 IMPLANT
GOWN STRL REUS W/ TWL LRG LVL3 (GOWN DISPOSABLE) ×1 IMPLANT
GOWN STRL REUS W/ TWL XL LVL3 (GOWN DISPOSABLE) ×2 IMPLANT
GOWN STRL REUS W/TWL 2XL LVL3 (GOWN DISPOSABLE) IMPLANT
GOWN STRL REUS W/TWL LRG LVL3 (GOWN DISPOSABLE) ×2
GOWN STRL REUS W/TWL XL LVL3 (GOWN DISPOSABLE) ×4
IMPL SPINE RISE 8-15-10-26M (Neuro Prosthesis/Implant) ×2 IMPLANT
IMPLANT SPINE RISE 8-15-10-26M (Neuro Prosthesis/Implant) ×6 IMPLANT
KIT BASIN OR (CUSTOM PROCEDURE TRAY) ×3 IMPLANT
KIT INFUSE SMALL (Orthopedic Implant) ×3 IMPLANT
KIT ROOM TURNOVER OR (KITS) ×3 IMPLANT
LIQUID BAND (GAUZE/BANDAGES/DRESSINGS) ×3 IMPLANT
MILL MEDIUM DISP (BLADE) ×3 IMPLANT
NEEDLE HYPO 21X1.5 SAFETY (NEEDLE) ×3 IMPLANT
NEEDLE HYPO 25X1 1.5 SAFETY (NEEDLE) ×3 IMPLANT
NS IRRIG 1000ML POUR BTL (IV SOLUTION) ×3 IMPLANT
PACK LAMINECTOMY NEURO (CUSTOM PROCEDURE TRAY) ×3 IMPLANT
PAD ARMBOARD 7.5X6 YLW CONV (MISCELLANEOUS) ×9 IMPLANT
PUTTY BONE DBX 5CC MIX (Putty) ×3 IMPLANT
ROD 75MM SPINAL (Rod) ×6 IMPLANT
SCREW AMP MODULAR CREO 6.5X45 (Screw) ×12 IMPLANT
SCREW CREO 6.5X40 (Screw) ×3 IMPLANT
SCREW CREO MOD AMP 5.5X45MM (Screw) ×3 IMPLANT
SCREW PA THRD CREO TULIP 5.5X4 (Head) ×12 IMPLANT
SCREW SET (Screw) ×6 IMPLANT
SPONGE LAP 4X18 X RAY DECT (DISPOSABLE) IMPLANT
SPONGE SURGIFOAM ABS GEL 100 (HEMOSTASIS) ×3 IMPLANT
STRIP CLOSURE SKIN 1/2X4 (GAUZE/BANDAGES/DRESSINGS) ×2 IMPLANT
STRIP VITOSS 25X100X4MM (Neuro Prosthesis/Implant) ×3 IMPLANT
SUT VIC AB 0 CT1 18XCR BRD8 (SUTURE) ×2 IMPLANT
SUT VIC AB 0 CT1 8-18 (SUTURE) ×4
SUT VIC AB 2-0 CT1 18 (SUTURE) ×6 IMPLANT
SUT VIC AB 4-0 PS2 27 (SUTURE) ×6 IMPLANT
SYR 20CC LL (SYRINGE) ×3 IMPLANT
TOWEL OR 17X24 6PK STRL BLUE (TOWEL DISPOSABLE) ×3 IMPLANT
TOWEL OR 17X26 10 PK STRL BLUE (TOWEL DISPOSABLE) ×3 IMPLANT
TRAY FOLEY BAG SILVER LF 16FR (SET/KITS/TRAYS/PACK) ×3 IMPLANT
TRAY FOLEY W/METER SILVER 16FR (SET/KITS/TRAYS/PACK) IMPLANT
WATER STERILE IRR 1000ML POUR (IV SOLUTION) ×3 IMPLANT

## 2016-01-12 NOTE — H&P (Signed)
Chelsea Bowman is an 75 y.o. female.   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 Due to her progression of clinical syndrome imaging findings and failure conservative treatment I recommended L1-L2 posterior lumbar interbody fusion redo posterior lateral fusion L2-3 removal of hardware from L2-L5. I've extensively gone over the risks and benefits of the procedure the patient as well as perioperative course expectations of outcome and alternatives of surgery and she understands and agrees to proceed forward.  Past Medical History  Diagnosis Date  . Diabetes mellitus without complication (HCC)     takes Metformin daily  . Hyperlipidemia     takes Lovastatin daily  . Environmental allergies     takes Claritin daily  . Depression     takes Prozac daily  . Hypertension     takes Coreg daily  . Hypothyroidism     takes Synthroid daily  . PONV (postoperative nausea and vomiting)   . CHF (congestive heart failure) (HCC)     takes Furosemide daily along with Potassium  . Pneumonia 11/2014  . Headache   . Weakness     numbness and tingling in hands and feet  . Peripheral neuropathy (HCC)   . Restless leg     takes Mirapex daily  . Chronic back pain     pseudoarthrosis  . Osteoporosis   . Arthritis   . Joint pain   . Joint swelling   . Hemorrhoids   . History of colon polyps     benign  . Urinary frequency   . Nocturia   . Sleep apnea   . Shortness of breath dyspnea     with exertion occasionally.Has Albuterol if needed  . Seizures (HCC)     had 2 but years and years ago.Was on Phenobarbital when she was [age]yrs old  . GERD (gastroesophageal reflux disease)     watches what she eats    Past Surgical History  Procedure Laterality  Date  . Cholecystectomy  5760yrs ago  . Cervical fusion      anterior  . Back surgery      x 4  . Colonoscopy    . Vagina surgery  1232yrs ago    unsure of name   . Lumbar puncture    . Posterior cervical fusion/foraminotomy N/A 07/05/2015    Procedure: Posterior Cervical Fusion with lateral mass fixation - Cervical two-three;  Surgeon: Donalee CitrinGary Aryn Kops, MD;  Location: [facility];  Service: Neurosurgery;  Laterality: N/A;    History reviewed. No pertinent family history. Social History:  reports that she has never smoked. She does not have any smokeless tobacco history on file. She reports that she does not drink alcohol or use illicit drugs.  Allergies:  Allergies  Allergen Reactions  . Dilantin [Phenytoin] Itching and Rash  . Latex Itching and Rash    Itching, burning  . Gabapentin Other (See Comments)    Makes head feel foggy  . Adhesive [Tape] Rash  . Codeine Itching and Rash    Medications Prior to Admission  Medication Sig Dispense Refill  . acetaminophen (TYLENOL) 325 MG tablet Take 650 mg by mouth every 6 (six) hours as needed for mild pain.    Marland Kitchen. aspirin 81 MG tablet Take 81 mg by mouth daily.    .Marland Kitchen  B Complex Vitamins (B COMPLEX 100 PO) Take 1 tablet by mouth daily.    . carvedilol (COREG) 6.25 MG tablet Take 6.25 mg by mouth 2 (two) times daily with a meal.    . cholecalciferol (VITAMIN D) 1000 UNITS tablet Take 1,000 Units by mouth daily.     . diphenhydrAMINE (BENADRYL) 25 mg capsule Take 25 mg by mouth every 8 (eight) hours as needed for itching.    Marland Kitchen. FLUoxetine (PROZAC) 40 MG capsule Take 40 mg by mouth daily.    . furosemide (LASIX) 40 MG tablet Take 40 mg by mouth daily.    . hydrocortisone (ANUSOL-HC) 25 MG suppository Place 1 suppository rectally 2 (two) times daily.  1  . ibuprofen (ADVIL,MOTRIN) 200 MG tablet Take 400 mg by mouth every 8 (eight) hours as needed for mild pain or moderate pain.    Marland Kitchen. levothyroxine (SYNTHROID, LEVOTHROID) 75 MCG tablet Take 75 mcg by  mouth daily before breakfast.    . loratadine (CLARITIN) 10 MG tablet Take 10 mg by mouth daily.    Marland Kitchen. lovastatin (MEVACOR) 40 MG tablet Take 40 mg by mouth at bedtime.    . meloxicam (MOBIC) 15 MG tablet Take 15 mg by mouth daily as needed for pain.    . metFORMIN (GLUCOPHAGE) 500 MG tablet Take 500 mg by mouth daily with breakfast.     . potassium chloride (K-DUR) 10 MEQ tablet Take 10 mEq by mouth 2 (two) times daily.     . pramipexole (MIRAPEX) 0.25 MG tablet Take 0.75 mg by mouth at bedtime.     Marland Kitchen. albuterol (PROVENTIL HFA;VENTOLIN HFA) 108 (90 BASE) MCG/ACT inhaler Inhale 1-2 puffs into the lungs every 6 (six) hours as needed for wheezing or shortness of breath.      Results for orders placed or performed during the hospital encounter of 01/12/16 (from the past 48 hour(s))  Glucose, capillary     Status: None   Collection Time: 01/12/16 10:44 AM  Result Value Ref Range   Glucose-Capillary 99 65 - 99 mg/dL   No results found.  Review of Systems  Constitutional: Negative.   HENT: Negative.   Eyes: Negative.   Respiratory: Negative.   Cardiovascular: Negative.   Gastrointestinal: Negative.   Musculoskeletal: Positive for myalgias and back pain.  Skin: Negative.   Neurological: Positive for dizziness.  Psychiatric/Behavioral: Negative.     Blood pressure 146/58, pulse 58, temperature 97.7 F (36.5 C), temperature source Oral, resp. rate 18, height 5\' 5"  (1.651 m), weight 95.482 kg (210 lb 8 oz), SpO2 97 %. Physical Exam  Constitutional: She is oriented to person, place, and time. She appears well-developed and well-nourished.  HENT:  Head: Normocephalic.  Eyes: Pupils are equal, round, and reactive to light.  Neck: Normal range of motion.  Respiratory: Effort normal.  GI: Soft.  Neurological: She is alert and oriented to person, place, and time. She has normal strength. GCS eye subscore is 4. GCS verbal subscore is 5. GCS motor subscore is 6.  Strength is 5 out of 5 in her  iliopsoas, quads, hip she's, gastric, into tibialis, EHL.  Skin: Skin is warm and dry.     Assessment/Plan 75 year old female presents for an expiration of fusion we will hardware L2-L5 and posterior lumbar interbody fusion L1-L2  Delita Chiquito P, MD 01/12/2016, 12:41 PM

## 2016-01-12 NOTE — Anesthesia Postprocedure Evaluation (Signed)
Anesthesia Post Note  Patient: Chelsea AmabileLinda S Bowman  Procedure(s) Performed: Procedure(s) (LRB): Posterior lumbar interbody  Fusion Lumbar one to lumbar two  redo lumbar two-three fusion  removal hardware- Lumbar two -lumbar five (N/A)  Patient location during evaluation: PACU Anesthesia Type: General Level of consciousness: awake and alert Pain management: pain level controlled Vital Signs Assessment: post-procedure vital signs reviewed and stable Respiratory status: spontaneous breathing, nonlabored ventilation, respiratory function stable and patient connected to nasal cannula oxygen Cardiovascular status: blood pressure returned to baseline and stable Postop Assessment: no signs of nausea or vomiting Anesthetic complications: no    Last Vitals:  Filed Vitals:   01/12/16 1845 01/12/16 1910  BP:  119/62  Pulse: 65 73  Temp: 36.7 C 36.7 C  Resp: 16 18    Last Pain:  Filed Vitals:   01/12/16 1924  PainSc: 2                  Wylie Russon,W. EDMOND

## 2016-01-12 NOTE — Anesthesia Preprocedure Evaluation (Addendum)
Anesthesia Evaluation  Patient identified by MRN, date of birth, ID band Patient awake    Reviewed: Allergy & Precautions, NPO status , Patient's Chart, lab work & pertinent test results, reviewed documented beta blocker date and time   History of Anesthesia Complications (+) PONV and history of anesthetic complications  Airway Mallampati: III  TM Distance: <3 FB Neck ROM: Limited    Dental  (+) Teeth Intact, Dental Advisory Given, Chipped,    Pulmonary shortness of breath, sleep apnea ,    Pulmonary exam normal breath sounds clear to auscultation       Cardiovascular hypertension, Pt. on medications and Pt. on home beta blockers (-) angina+CHF  (-) Past MI Normal cardiovascular exam Rhythm:Regular Rate:Normal  2014 Cardiology Note: ECHO revealed preserved RV and LV EF. No significant valvular heart disease. I/IV Diastolic Dysfunction which is consistent with findings in patient's age group. 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  Reproductive/Obstetrics                           Anesthesia Physical Anesthesia Plan  ASA: III  Anesthesia Plan: General   Post-op Pain Management:    Induction: Intravenous  Airway Management Planned: Oral ETT  Additional Equipment:   Intra-op Plan:   Post-operative  Plan: Extubation in OR  Informed Consent: I have reviewed the patients History and Physical, chart, labs and discussed the procedure including the risks, benefits and alternatives for the proposed anesthesia with the patient or authorized representative who has indicated his/her understanding and acceptance.   Dental advisory given  Plan Discussed with: CRNA  Anesthesia Plan Comments: (Risks/benefits of general anesthesia discussed with patient including risk of damage to teeth, lips, gum, and tongue, nausea/vomiting, allergic reactions to medications, and the possibility of heart attack, stroke and death.  All patient questions answered.  Patient wishes to proceed.)        Anesthesia Quick Evaluation

## 2016-01-12 NOTE — Op Note (Signed)
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  Postoperative diagnosis: Same  Procedure: #1 exploration of fusion removal of hardware L2-L5 with removal of L2-L3 solera cortical screws the nuts and rods of the M8 5.5 pedicle screw system from L3-L5 with removal of bilateral L4 and L5 screws  #2 decompression L1-L2 with complete medial facetectomies radical foraminotomies in excess and requiring more work than would be needed with a standard interbody fusion  #3 posterior lumbar interbody fusion L1-L2 utilizing the globus rise expandable cage system with locally harvested autograft mixed with DBX mix and BMP  #4 pedicle screw fixation L1-L3 utilizing the globus Creole and modular 5.5 pedicle screw system with bilateral pedicle screws at L1 left-sided L2 and tying into the old M8 5.5 Medtronic pedicle screws left and L3  #5 redo posterior lateral arthrodesis 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 I extensively went over the risks and benefits of that operation with her as well as perioperative course  expectations of outcome and alternatives surgery and she understands and agrees to proceed forward  Operative procedure: Patient was brought into the or was induced on general anesthesia positioned prone the level of the Wilson frame her back was prepped and draped in routine sterile fashion her old incision was opened up and extended cephalad subperiosteal dissections care lamina of L1 and L2 exposing the TPs at L1 and L2 the hardware was identified from L2 down L5 I disconnected the connector as well as the rods of the Solera cortical screw set removed the cortical screws which were very loose bilaterally I then disconnected the knots of the Medtronic hardware from L3-L5 remove the rods and remove the L4 and L5 screws bilaterally. The fusion did appear to be solid. However L2-3 was pseudoarthrosis and unstable. At this point I performed a decompressive laminectomy with removal of spinous process at L1 complete central decompression complete medial facetectomies radical foraminotomies of the L1 and L2 nerve roots. There was marked stenosis and severe hourglass compression of thecal sac at that level. Aggressive undercutting the superior tickling facet at L2 allowed access to the lateral margins disc space. Epidural veins are collided disc spaces cleanout bilaterally and utilizing sequential distraction with a 9 distractor opened up the disc space and also opened up the neural foramina. With a 9 distractor in place continue to prepare the endplates and inserted an 8-14 expandable 10 lordotic 26mm length cage. I then removed the distractor worked on the preparation the end plates and the opposite side as well as preparation of central endplates. Packed local autograft mixed with BMP centrally and laterally I inserted the contralateral cage expanded both cages in a similar fashion. Fluoroscopy used confirmed reopening of the  disc space adequate decompression and good position of the implants. At this point I placed  pedicle screws at L1 and L2 first working on the left side cannulated with pedicle probed tapped with a 55 Probed again and 6 5 x 40 screw inserted at L1 on the left then place the L2 screw this also had good purchase with a 6 5 x 45 screw in similar fashion on the right I placed an L1 and L2 screw however the L1 initially out of place medially had to reposition this and L2 due to the previous placement a cortical screw fracture the inferior lateral aspect of the pedicle so as not able to reposition and put another screw at L2 on the right. So I left the screw out of that location copiously irrigated the wound aggressively decorticated the TPs from L1 and L2 and the lateral fusion mass at L3 as well as the TPs L3 I then packed posterior lateral bone with BMP V toss DBX and autograft from L1-L3. Then inspected all the foramina and they all confirmed be patent Gelfoam was overlaid top of the dura then I assembled the heads of the globus screws at L1 and L2 then contoured 2 rods tightened all those down compressed the right side from L1-L3 with decided did not have the L2 screw and then placed a cross-link. Reinspected the foramina to confirm patency no migration of graft material overlaid Gelfoam a top of the dura placed a large Hemovac drains were to vancomycin powder in the wound and injected fashion with experell then closed wound in layers with after Vicryl's (proximal vancomycin powder in the extra fascial space close subcutaneous tissue and subcuticular in the skin Dermabond benzo and Steri-Strips were all applied patient recovered in stable condition. At the end the case all needle counts and sponge counts were correct.

## 2016-01-12 NOTE — Anesthesia Procedure Notes (Signed)
Procedure Name: Intubation Date/Time: 01/12/2016 12:55 PM Performed by: Charm BargesBUTLER, Stephaie Dardis R Pre-anesthesia Checklist: Patient identified, Emergency Drugs available, Suction available and Patient being monitored Patient Re-evaluated:Patient Re-evaluated prior to inductionOxygen Delivery Method: Circle System Utilized Preoxygenation: Pre-oxygenation with 100% oxygen Intubation Type: IV induction Ventilation: Mask ventilation without difficulty Laryngoscope Size: Glidescope Grade View: Grade I Tube type: Oral Number of attempts: 1 Airway Equipment and Method: Rigid stylet and Video-laryngoscopy Placement Confirmation: ETT inserted through vocal cords under direct vision,  positive ETCO2 and breath sounds checked- equal and bilateral Secured at: 20 cm Tube secured with: Tape Dental Injury: Teeth and Oropharynx as per pre-operative assessment

## 2016-01-12 NOTE — Transfer of Care (Signed)
Immediate Anesthesia Transfer of Care Note  Patient: Chelsea Bowman  Procedure(s) Performed: Procedure(s): Posterior lumbar interbody  Fusion Lumbar one to lumbar two  redo lumbar two-three fusion  removal hardware- Lumbar two -lumbar five (N/A)  Patient Location: PACU  Anesthesia Type:General  Level of Consciousness: awake, oriented and patient cooperative  Airway & Oxygen Therapy: Patient Spontanous Breathing and Patient connected to nasal cannula oxygen  Post-op Assessment: Report given to RN, Post -op Vital signs reviewed and stable and Patient moving all extremities  Post vital signs: Reviewed and stable  Last Vitals:  Filed Vitals:   01/12/16 1042 01/12/16 1719  BP: 146/58   Pulse: 58   Temp: 36.5 C 36.5 C  Resp: 18     Last Pain: There were no vitals filed for this visit.    Patients Stated Pain Goal: 7 (01/12/16 1056)  Complications: No apparent anesthesia complications

## 2016-01-12 NOTE — Progress Notes (Signed)
Spoke with Dr.Turk and notified him of pt mistakingly thinking that her blood sugar was 59 so she drank a 1/2 lemonade without pulp at 0730. She states her machine was upside down so sugar was actually 95.

## 2016-01-13 ENCOUNTER — Inpatient Hospital Stay (HOSPITAL_COMMUNITY): Payer: Medicare HMO

## 2016-01-13 ENCOUNTER — Encounter (HOSPITAL_COMMUNITY): Payer: Self-pay | Admitting: General Practice

## 2016-01-13 NOTE — Evaluation (Signed)
Physical Therapy Evaluation Patient Details Name: Chelsea AmabileLinda S Bowman MRN: [id number] DOB: 04/07/1941 Today's Date: 01/13/2016   History of Present Illness  Patient is a 75 y/o female with hx of CHF, HTN, DM, OSA, seizures, obesity presents s/p L1-2 PLIF, redo fusion L2-3, removal of hardware L2-L5.  Clinical Impression  Patient presents with pain, generalized weakness and post surgical deficits s/p above impacting mobility. Tolerated transfers and gait training with Min A-Min guard assist for balance/safety. Pt plans to have someone stay with her 24/7 at home- friend or granddaughter. Education on back precautions, positioning, etc. Will follow acutely to maximize independence and mobility prior to return home.    Follow Up Recommendations Home health PT;Supervision for mobility/OOB    Equipment Recommendations  None recommended by PT    Recommendations for Other Services OT consult     Precautions / Restrictions Precautions Precautions: Back Precaution Booklet Issued: No Precaution Comments: Reviewed back precautions. Required Braces or Orthoses: Spinal Brace Spinal Brace: Lumbar corset;Applied in sitting position Restrictions Weight Bearing Restrictions: No      Mobility  Bed Mobility Overal bed mobility: Needs Assistance Bed Mobility: Rolling;Sidelying to Sit Rolling: Supervision Sidelying to sit: Supervision       General bed mobility comments: HOB flat, use of rail for support. Cues for log roll technique.  Transfers Overall transfer level: Needs assistance Equipment used: Rolling walker (2 wheeled) Transfers: Sit to/from Stand Sit to Stand: Min assist         General transfer comment: Min A to boost from EOB with cues for hand placement. Pain in right foot upon standing. Transferred to chair post ambulation bout.  Ambulation/Gait Ambulation/Gait assistance: Min guard Ambulation Distance (Feet): 100 Feet Assistive device: Rolling walker (2 wheeled) Gait  Pattern/deviations: Step-through pattern;Decreased stride length Gait velocity: decreased   General Gait Details: Slow, mildly unsteady gait with cues for upright posture. DOE. Sp02 87% on RA. Resolved quickly to 90% after 30 sec of rest.  Stairs            Wheelchair Mobility    Modified Rankin (Stroke Patients Only)       Balance Overall balance assessment: Needs assistance Sitting-balance support: Feet supported;No upper extremity supported Sitting balance-Leahy Scale: Good     Standing balance support: During functional activity Standing balance-Leahy Scale: Poor Standing balance comment: Reliant on BUEs for support.                             Pertinent Vitals/Pain Pain Assessment: 0-10 Pain Score: 7  Pain Location: back and right foot Pain Descriptors / Indicators: Sharp;Sore Pain Intervention(s): Monitored during session;Limited activity within patient's tolerance;RN gave pain meds during session;Repositioned    Home Living Family/patient expects to be discharged to:: Private residence Living Arrangements: Alone Available Help at Discharge: Family;Friend(s);Available 24 hours/day Type of Home: House Home Access: Stairs to enter Entrance Stairs-Rails: Right Entrance Stairs-Number of Steps: 3-5 depending on which entrance Home Layout: One level Home Equipment: Walker - 2 wheels;Cane - single point;Shower seat;Wheelchair - manual (lift chair) Additional Comments: Will have friend or granddaughter assist at home    Prior Function Level of Independence: Independent with assistive device(s)         Comments: Using SPC PTA.     Hand Dominance   Dominant Hand: Right    Extremity/Trunk Assessment   Upper Extremity Assessment: Defer to OT evaluation           Lower Extremity Assessment:  Generalized weakness;RLE deficits/detail;LLE deficits/detail         Communication   Communication: No difficulties  Cognition Arousal/Alertness:  Awake/alert Behavior During Therapy: WFL for tasks assessed/performed Overall Cognitive Status: Within Functional Limits for tasks assessed                      General Comments      Exercises        Assessment/Plan    PT Assessment Patient needs continued PT services  PT Diagnosis Difficulty walking;Acute pain   PT Problem List Decreased strength;Pain;Cardiopulmonary status limiting activity;Decreased balance;Decreased mobility;Decreased activity tolerance;Decreased knowledge of precautions;Impaired sensation  PT Treatment Interventions Balance training;Gait training;Stair training;Functional mobility training;Therapeutic activities;Therapeutic exercise;Patient/family education   PT Goals (Current goals can be found in the Care Plan section) Acute Rehab PT Goals Patient Stated Goal: to go home PT Goal Formulation: With patient Time For Goal Achievement: 01/27/16 Potential to Achieve Goals: Good    Frequency Min 5X/week   Barriers to discharge Inaccessible home environment stairs    Co-evaluation               End of Session Equipment Utilized During Treatment: Gait belt;Back brace Activity Tolerance: Patient tolerated treatment well Patient left: in chair;with call bell/phone within reach;with chair alarm set Nurse Communication: Mobility status         Time: [id number] PT Time Calculation (min) (ACUTE ONLY): 23 min   Charges:   PT Evaluation $PT Eval Moderate Complexity: 1 Procedure PT Treatments $Gait Training: 8-22 mins   PT G Codes:        Chelsea Bowman 01/13/2016, 8:49 AM Chelsea Bowman, PT, DPT [phone]

## 2016-01-13 NOTE — Progress Notes (Signed)
Foley removed pt sitting up on side of bed. Pain much better after pain med

## 2016-01-13 NOTE — Care Management Important Message (Signed)
Important Message  Patient Details  Name: Chelsea Bowman MRN: [id number] Date of Birth: 06/26/1941   Medicare Important Message Given:  Yes    Bernadette HoitShoffner, Lysha Schrade Coleman 01/13/2016, 9:53 AM

## 2016-01-13 NOTE — Care Management Note (Signed)
Case Management Note  Patient Details  Name: Chelsea Bowman MRN: [id number] Date of Birth: 02/06/1941  Subjective/Objective:      Pt underwent:   Posterior lumbar interbody Fusion Lumbar one to lumbar two redo lumbar two-three fusion removal hardware- Lumbar two -lumbar five. She is from home alone.     Action/Plan: [facility]. CM following for discharge needs.   Expected Discharge Date:                  Expected Discharge Plan:  Home w Home Health Services  In-House Referral:     Discharge planning Services  CM Consult  Post Acute Care Choice:    Choice offered to:     DME Arranged:    DME Agency:     HH Arranged:    HH Agency:     Status of Service:  In process, will continue to follow  If discussed at Long Length of Stay Meetings, dates discussed:    Additional Comments:  Kermit BaloKelli F Edward Guthmiller, RN 01/13/2016, 11:40 AM

## 2016-01-13 NOTE — Progress Notes (Signed)
Patient ID: Chelsea AmabileLinda S Goswick, female   DOB: 11/20/1940, 75 y.o.   MRN: [id number] Patient doing well no radicular symptoms some pain in her right foot to start some active aggressive top of her foot minimal back pain  Strength 5 out of 5 wound clean dry and intact  Mobilize with physical and occupational therapy check some x-rays of her right foot.

## 2016-01-13 NOTE — Progress Notes (Signed)
Occupational Therapy Evaluation Patient Details Name: Chelsea Bowman MRN: [id number] DOB: 09/07/1940 Today's Date: 01/13/2016    History of Present Illness Patient is a 75 y/o female with hx of CHF, HTN, DM, OSA, seizures, obesity presents s/p L1-2 PLIF, redo fusion L2-3, removal of hardware L2-L5.   Clinical Impression   Pt admitted with the above diagnoses and presents with below problem list. Pt will benefit from continued acute OT to address the below listed deficits and maximize independence with BADLs prior to d/c home with family assisting. PTA pt was mod I with ADLs. Pt is currently min guard to min A with LB ADLs, transfers and functional mobility.      Follow Up Recommendations  No OT follow up;Supervision/Assistance - 24 hour    Equipment Recommendations  None recommended by OT    Recommendations for Other Services       Precautions / Restrictions Precautions Precautions: Back Precaution Booklet Issued: No Precaution Comments: Reviewed back precautions. Required Braces or Orthoses: Spinal Brace Spinal Brace: Lumbar corset;Applied in sitting position Restrictions Weight Bearing Restrictions: No      Mobility Bed Mobility Overal bed mobility: Needs Assistance Bed Mobility: Rolling;Sidelying to Sit Rolling: Supervision Sidelying to sit: Supervision       General bed mobility comments: HOB flat, use of rail for support. Cues for log roll technique.  Transfers Overall transfer level: Needs assistance Equipment used: Rolling walker (2 wheeled) Transfers: Sit to/from Stand Sit to Stand: Min guard;Min assist         General transfer comment: light min A to steady. From EOB and 3n1.    Balance Overall balance assessment: Needs assistance Sitting-balance support: Feet supported;No upper extremity supported Sitting balance-Leahy Scale: Good     Standing balance support: During functional activity Standing balance-Leahy Scale: Poor Standing balance  comment: rw for support                            ADL Overall ADL's : Needs assistance/impaired Eating/Feeding: Set up;Sitting   Grooming: Set up;Sitting   Upper Body Bathing: Set up;Sitting   Lower Body Bathing: Min guard;Sit to/from stand;With adaptive equipment;Minimal assistance   Upper Body Dressing : Set up;Sitting   Lower Body Dressing: Min guard;With adaptive equipment;Sit to/from stand;Minimal assistance   Toilet Transfer: Min guard   Toileting- ArchitectClothing Manipulation and Hygiene: Min guard;Minimal assistance;Sit to/from stand   Tub/ Shower Transfer: Walk-in shower;Minimal assistance;Min guard;Ambulation;3 in 1;Rolling walker   Functional mobility during ADLs: Supervision/safety General ADL Comments: Pt completed toilet transfer, anterior pericare, and household distace ambulation as detailed above. ADL and precautions education provided.      Vision     Perception     Praxis      Pertinent Vitals/Pain Pain Assessment: 0-10 Pain Score: 7  Pain Location: right foot and back Pain Descriptors / Indicators: Sharp;Sore Pain Intervention(s): Limited activity within patient's tolerance;Premedicated before session;Repositioned;Monitored during session     Hand Dominance Right   Extremity/Trunk Assessment Upper Extremity Assessment Upper Extremity Assessment: Overall WFL for tasks assessed   Lower Extremity Assessment Lower Extremity Assessment: Defer to PT evaluation RLE Sensation: decreased light touch LLE Sensation: decreased light touch       Communication Communication Communication: No difficulties   Cognition Arousal/Alertness: Awake/alert Behavior During Therapy: WFL for tasks assessed/performed Overall Cognitive Status: Within Functional Limits for tasks assessed  General Comments       Exercises       Shoulder Instructions      Home Living Family/patient expects to be discharged to:: Private  residence Living Arrangements: Alone Available Help at Discharge: Family;Friend(s);Available 24 hours/day Type of Home: House Home Access: Stairs to enter Entergy CorporationEntrance Stairs-Number of Steps: 3-5 depending on which entrance Entrance Stairs-Rails: Right Home Layout: One level     Bathroom Shower/Tub: Producer, television/film/videoWalk-in shower   Bathroom Toilet: Standard Bathroom Accessibility: Yes   Home Equipment: Environmental consultantWalker - 2 wheels;Cane - single point;Shower seat;Wheelchair - Building surveyormanual;Adaptive equipment;Bedside commode Adaptive Equipment: Reacher Additional Comments: Will have friend or granddaughter assist at home      Prior Functioning/Environment Level of Independence: Independent with assistive device(s)        Comments: Using SPC PTA.    OT Diagnosis: Acute pain   OT Problem List: Impaired balance (sitting and/or standing);Decreased knowledge of use of DME or AE;Decreased knowledge of precautions;Pain   OT Treatment/Interventions: Self-care/ADL training;DME and/or AE instruction;Therapeutic activities;Patient/family education;Balance training    OT Goals(Current goals can be found in the care plan section) Acute Rehab OT Goals Patient Stated Goal: to go home OT Goal Formulation: With patient Time For Goal Achievement: 01/20/16 Potential to Achieve Goals: Good ADL Goals Pt Will Perform Grooming: with modified independence;standing Pt Will Perform Lower Body Bathing: with modified independence;sit to/from stand;with adaptive equipment Pt Will Perform Lower Body Dressing: with modified independence;with adaptive equipment;sit to/from stand Pt Will Transfer to Toilet: with modified independence;ambulating Pt Will Perform Toileting - Clothing Manipulation and hygiene: with modified independence;sitting/lateral leans;sit to/from stand Pt Will Perform Tub/Shower Transfer: Shower transfer;with supervision;ambulating;3 in 1;rolling walker  OT Frequency: Min 2X/week   Barriers to D/C:             Co-evaluation              End of Session Equipment Utilized During Treatment: Rolling walker;Back brace  Activity Tolerance: Patient tolerated treatment well Patient left: in bed;with call bell/phone within reach;with bed alarm set;with SCD's reapplied   Time: [id number] OT Time Calculation (min): 25 min Charges:  OT General Charges $OT Visit: 1 Procedure OT Evaluation $OT Eval Low Complexity: 1 Procedure OT Treatments $Self Care/Home Management : 8-22 mins G-Codes:    Chelsea Bowman, Chelsea Bowman 01/13/2016, 10:49 AM

## 2016-01-14 ENCOUNTER — Inpatient Hospital Stay (HOSPITAL_COMMUNITY): Payer: Medicare HMO

## 2016-01-14 MED ORDER — KETOROLAC TROMETHAMINE 15 MG/ML IJ SOLN
15.0000 mg | Freq: Three times a day (TID) | INTRAMUSCULAR | Status: AC
  Administered 2016-01-14 – 2016-01-15 (×3): 15 mg via INTRAVENOUS
  Filled 2016-01-14 (×3): qty 1

## 2016-01-14 MED FILL — Sodium Chloride IV Soln 0.9%: INTRAVENOUS | Qty: 1000 | Status: AC

## 2016-01-14 MED FILL — Heparin Sodium (Porcine) Inj 1000 Unit/ML: INTRAMUSCULAR | Qty: 30 | Status: AC

## 2016-01-14 NOTE — Progress Notes (Signed)
Physical Therapy Treatment Patient Details Name: Chelsea AmabileLinda S Bowman MRN: [id number] DOB: 03/22/1941 Today's Date: 01/14/2016    History of Present Illness Patient is a 75 y/o female with hx of CHF, HTN, DM, OSA, seizures, obesity presents s/p L1-2 PLIF, redo fusion L2-3, removal of hardware L2-L5.    PT Comments    Patient is progressing well toward mobility goals. Tolerated stair training well with min guard. Current plan remains appropriate.   Follow Up Recommendations  Home health PT;Supervision for mobility/OOB     Equipment Recommendations  None recommended by PT    Recommendations for Other Services OT consult     Precautions / Restrictions Precautions Precautions: Back Precaution Booklet Issued: No Precaution Comments: Reviewed back precautions. Required Braces or Orthoses: Spinal Brace Spinal Brace: Lumbar corset;Applied in sitting position Restrictions Weight Bearing Restrictions: No    Mobility  Bed Mobility Overal bed mobility: Modified Independent Bed Mobility: Rolling;Sidelying to Sit Rolling: Modified independent (Device/Increase time) Sidelying to sit: Modified independent (Device/Increase time)       General bed mobility comments: carry over of technique; maintained back precautions  Transfers Overall transfer level: Needs assistance Equipment used: Rolling walker (2 wheeled) Transfers: Sit to/from Stand Sit to Stand: Supervision         General transfer comment: from EOB and BSC; safe hand placement and technique; cues for bending at the knees and not the back   Ambulation/Gait Ambulation/Gait assistance: Supervision Ambulation Distance (Feet): 200 Feet Assistive device: Rolling walker (2 wheeled) Gait Pattern/deviations: Step-through pattern;Decreased stride length;Decreased weight shift to right Gait velocity: decreased   General Gait Details: cues for posture and cadence; slow, steady gait   Stairs Stairs: Yes Stairs assistance: Min  guard Stair Management: One rail Right;Sideways Number of Stairs: 5 General stair comments: cues for sequencing and technique; good safety awareness; no instability  Wheelchair Mobility    Modified Rankin (Stroke Patients Only)       Balance Overall balance assessment: Needs assistance Sitting-balance support: No upper extremity supported;Feet supported Sitting balance-Leahy Scale: Good     Standing balance support: During functional activity Standing balance-Leahy Scale: Fair                      Cognition Arousal/Alertness: Awake/alert Behavior During Therapy: WFL for tasks assessed/performed Overall Cognitive Status: Within Functional Limits for tasks assessed                      Exercises      General Comments        Pertinent Vitals/Pain Pain Assessment: 0-10 Pain Score: 3  Pain Location: R foot Pain Descriptors / Indicators: Aching;Guarding;Sore Pain Intervention(s): Limited activity within patient's tolerance;Premedicated before session;Repositioned    Home Living                      Prior Function            PT Goals (current goals can now be found in the care plan section) Acute Rehab PT Goals Patient Stated Goal: to go home Progress towards PT goals: Progressing toward goals    Frequency  Min 5X/week    PT Plan Current plan remains appropriate    Co-evaluation             End of Session Equipment Utilized During Treatment: Gait belt;Back brace Activity Tolerance: Patient tolerated treatment well Patient left: in chair;with call bell/phone within reach     Time: 4098-11911057-1129 PT Time Calculation (  min) (ACUTE ONLY): 32 min  Charges:  $Gait Training: 8-22 mins $Therapeutic Activity: 8-22 mins                    G Codes:      Chelsea MoundKellyn R Felice Bowman Chelsea Bowman, PTA Pager: [phone]   01/14/2016, 11:47 AM

## 2016-01-15 LAB — GLUCOSE, CAPILLARY
Glucose-Capillary: 142 mg/dL — ABNORMAL HIGH (ref 65–99)
Glucose-Capillary: 140 mg/dL — ABNORMAL HIGH (ref 65–99)

## 2016-01-15 NOTE — Consult Note (Signed)
Reason for Consult:  Right foot/ankle pain with partial tendon tear Referring Physician: Dr. Wynetta Emeryram, [facility]  Chelsea Bowman is an 75 y.o. female.  HPI:   75 yo female who had recent lumbar spine surgery.  In the hospital, she is complaining of a sharp stabbing pain in her right foot that only occurs occasionally with weight-bearing.  She points to the top of her foot and medially.  After continued pain, a MRI was obtained that showed a partial right anterior tibialis tendon tear.  Ortho is asked to assess for recommendations for mobility.  Past Medical History  Diagnosis Date  . Diabetes mellitus without complication (HCC)     takes Metformin daily  . Hyperlipidemia     takes Lovastatin daily  . Environmental allergies     takes Claritin daily  . Depression     takes Prozac daily  . Hypertension     takes Coreg daily  . Hypothyroidism     takes Synthroid daily  . PONV (postoperative nausea and vomiting)   . CHF (congestive heart failure) (HCC)     takes Furosemide daily along with Potassium  . Pneumonia 11/2014  . Headache   . Weakness     numbness and tingling in hands and feet  . Peripheral neuropathy (HCC)   . Restless leg     takes Mirapex daily  . Chronic back pain     pseudoarthrosis  . Osteoporosis   . Arthritis   . Joint pain   . Joint swelling   . Hemorrhoids   . History of colon polyps     benign  . Urinary frequency   . Nocturia   . Sleep apnea   . Shortness of breath dyspnea     with exertion occasionally.Has Albuterol if needed  . Seizures (HCC)     had 2 but years and years ago.Was on Phenobarbital when she was [age]yrs old  . GERD (gastroesophageal reflux disease)     watches what she eats    Past Surgical History  Procedure Laterality Date  . Cholecystectomy  1234yrs ago  . Cervical fusion      anterior  . Back surgery      x 4  . Colonoscopy    . Vagina surgery  3717yrs ago    unsure of name   . Lumbar puncture    . Posterior cervical  fusion/foraminotomy N/A 07/05/2015    Procedure: Posterior Cervical Fusion with lateral mass fixation - Cervical two-three;  Surgeon: Donalee CitrinGary Cram, MD;  Location: [facility];  Service: Neurosurgery;  Laterality: N/A;  . Posterior lumbar fusion  01/12/2016    History reviewed. No pertinent family history.  Social History:  reports that she has never smoked. She has never used smokeless tobacco. She reports that she does not drink alcohol or use illicit drugs.  Allergies:  Allergies  Allergen Reactions  . Dilantin [Phenytoin] Itching and Rash  . Latex Itching and Rash    Itching, burning  . Gabapentin Other (See Comments)    Makes head feel foggy  . Adhesive [Tape] Rash  . Codeine Itching and Rash    Medications: I have reviewed the patient's current medications.  No results found for this or any previous visit (from the past 48 hour(s)).  Mr Foot Right Wo Contrast  01/14/2016  CLINICAL DATA:  Right foot pain and swelling on the dorsum of the foot. EXAM: MRI OF THE RIGHT FOREFOOT WITHOUT CONTRAST TECHNIQUE: Multiplanar, multisequence MR imaging was performed.  No intravenous contrast was administered. COMPARISON:  Radiographs dated 01/13/2016 FINDINGS: The patient has inflammation and a partial thickness longitudinal tear of the superficial aspect of the distal tibialis anterior tendon with some edema in the insertion of that tendon on the distal lateral aspect of the medial cuneiform and the base of the first metatarsal. Focal arthritic changes at the anterior lateral aspect of the distal tibia and dome of the talus. No other significant bone abnormality. Tendons and muscles appear normal. No joint effusions. Nonspecific soft tissue edema on the dorsum of the foot. IMPRESSION: Partial thickness longitudinal tear of the distal tibialis anterior tendon extending to the insertion on the medial cuneiform and base of the first metatarsal. This is exactly the site of the patient's maximum pain.  Electronically Signed   By: Francene BoyersJames  Maxwell M.D.   On: 01/14/2016 17:17   Dg Foot 2 Views Right  01/13/2016  CLINICAL DATA:  No injury,swelling top of rt foot For 2 days ,,hx dm EXAM: RIGHT FOOT - 2 VIEW COMPARISON:  None. FINDINGS: Significant soft tissue swelling is identified along the midfoot region. No associated soft tissue gas or foreign body. No acute fracture or subluxation. Small plantar spur is present. IMPRESSION: Soft tissue swelling. Electronically Signed   By: Norva PavlovElizabeth  Brown M.D.   On: 01/13/2016 13:27    ROS Blood pressure 122/56, pulse 76, temperature 98.7 F (37.1 C), temperature source Oral, resp. rate 18, height 5\' 5"  (1.651 m), weight 95.482 kg (210 lb 8 oz), SpO2 98 %. Physical Exam  Constitutional: She is oriented to person, place, and time. She appears well-developed and well-nourished.  Musculoskeletal:       Feet:  Neurological: She is alert and oriented to person, place, and time.   Her foot has great perfusion. She is able to easily dorsiflex and plantar flex her ankle and toes   Assessment/Plan: Right ankle pain with partial anterior tibialis tendon tear 1)  This should do well with time and no surgery is indicated at the moment given her good function.  Her pain is mainly with weight bearing.  We can tried a cam walker boot for support when ambulating and she only needs to wear it when up.  Can follow-up as an outpatient in 2 weeks if needed.  BLACKMAN,CHRISTOPHER Y 01/15/2016, 9:32 AM

## 2016-01-15 NOTE — Progress Notes (Signed)
Orthopedic Tech Progress Note Patient Details:  Chelsea Bowman 06/12/1941 [id number]  Ortho Devices Type of Ortho Device: CAM walker Ortho Device/Splint Interventions: Application   [name] 01/15/2016, 10:25 AM

## 2016-01-15 NOTE — Progress Notes (Signed)
Patient ID: Chelsea AmabileLinda S Bowman, female   DOB: 05/05/1941, 75 y.o.   MRN: [id number] Chelsea Bowman is doing well just overall minimal back pain and no radicular symptoms just pain in her right foot.  Strength 5 out of 5 clean dry intact tenderness right foot  i have consult with orthopedic surgery mobilizewith physical therapy.

## 2016-01-15 NOTE — Progress Notes (Signed)
Physical Therapy Treatment Patient Details Name: Chelsea AmabileLinda S Bowman MRN: [id number] DOB: 09/08/1940 Today's Date: 01/15/2016    History of Present Illness Patient is a 75 y/o female with hx of CHF, HTN, DM, OSA, seizures, obesity presents s/p L1-2 PLIF, redo fusion L2-3, removal of hardware L2-L5.    PT Comments    Pt and family educated on how to don cam boot while pt sat at bedside. Due to back precautions, pt will need assistance with this until she gets to point she can sit figure 4 style without breaking back precautions to do this herself. Pt continues to progress toward goals. Patient safe to D/C from a mobility standpoint based on progression towards goals set on PT eval.    Follow Up Recommendations  Home health PT;Supervision for mobility/OOB     Equipment Recommendations  None recommended by PT    Recommendations for Other Services OT consult     Precautions / Restrictions Precautions Precautions: Back Precaution Comments: Reviewed back precautions. Required Braces or Orthoses: Spinal Brace Spinal Brace: Lumbar corset;Applied in sitting position Restrictions Weight Bearing Restrictions: No    Mobility  Bed Mobility Overal bed mobility: Needs Assistance Bed Mobility: Sit to Sidelying Rolling: Supervision Sidelying to sit: Min guard     Sit to sidelying: Min guard General bed mobility comments: bed flat and no rails. reminder cues at times for log roll technique to adhere to back precautions.  Transfers Overall transfer level: Needs assistance Equipment used: Rolling walker (2 wheeled) Transfers: Sit to/from Stand           General transfer comment: demo'd safe technique to/from bed and elevated toilet  Ambulation/Gait Ambulation/Gait assistance: Supervision Ambulation Distance (Feet): 250 Feet Assistive device: Rolling walker (2 wheeled) Gait Pattern/deviations: Step-through pattern;Decreased stride length Gait velocity: decreased Gait velocity  interpretation: Below normal speed for age/gender General Gait Details: occasional cues for posture       Cognition Arousal/Alertness: Awake/alert Behavior During Therapy: WFL for tasks assessed/performed Overall Cognitive Status: Within Functional Limits for tasks assessed           Pertinent Vitals/Pain Pain Assessment: 0-10 Pain Score: 4  Pain Location: back and right foot Pain Descriptors / Indicators: Aching;Sore;Discomfort Pain Intervention(s): Limited activity within patient's tolerance;Monitored during session;Premedicated before session;Repositioned (used cam boot with gait on right foot)     PT Goals (current goals can now be found in the care plan section) Acute Rehab PT Goals Patient Stated Goal: to go home PT Goal Formulation: With patient Time For Goal Achievement: 01/27/16 Potential to Achieve Goals: Good Progress towards PT goals: Progressing toward goals    Frequency  Min 5X/week    PT Plan Current plan remains appropriate    End of Session Equipment Utilized During Treatment: Gait belt;Back brace Activity Tolerance: Patient tolerated treatment well Patient left: in bed;with call bell/phone within reach;with family/visitor present     Time: 4098-11911408-1431 PT Time Calculation (min) (ACUTE ONLY): 23 min  Charges:  $Gait Training: 8-22 mins $Therapeutic Activity: 8-22 mins           Sallyanne KusterBury, Marcelles Clinard 01/15/2016, 2:35 PM  Sallyanne KusterKathy Johnpaul Gillentine, PTA, CLT Acute Rehab Services [phone] 01/15/2016, 2:37 PM

## 2016-01-15 NOTE — Progress Notes (Signed)
Occupational Therapy Treatment Patient Details Name: Chelsea AmabileLinda S Crigger MRN: [id number] DOB: 08/24/1940 Today's Date: 01/15/2016    History of present illness Patient is a 75 y/o female with hx of CHF, HTN, DM, OSA, seizures, obesity presents s/p L1-2 PLIF, redo fusion L2-3, removal of hardware L2-L5.   OT comments  Pt making good progress toward OT goals this session. Pt able to recall 3/3 back precautions and maintain throughout functional activities. Able to perform toilet transfer and grooming standing at the sink with min guard assist. Set up to don back brace in sitting and max assist for LB dressing; family to assist upon d/c home. SpO2=90% on RA following activity; 2L O2 via nasal canula reapplied. D/c plan remains appropriate. Will continue to follow acutely.    Follow Up Recommendations  No OT follow up;Supervision/Assistance - 24 hour    Equipment Recommendations  None recommended by OT    Recommendations for Other Services      Precautions / Restrictions Precautions Precautions: Back Precaution Comments: Pt able to verbally recall 3/3 back precautions and maintain throughout ADL. Required Braces or Orthoses: Spinal Brace Spinal Brace: Lumbar corset;Applied in sitting position Restrictions Weight Bearing Restrictions: No       Mobility Bed Mobility Overal bed mobility: Needs Assistance Bed Mobility: Rolling;Sidelying to Sit;Sit to Sidelying Rolling: Supervision Sidelying to sit: Supervision     Sit to sidelying: Supervision General bed mobility comments: Supervision for safety. HOB flat with use of bed rails. Good log roll technique.  Transfers Overall transfer level: Needs assistance Equipment used: Rolling walker (2 wheeled) Transfers: Sit to/from Stand Sit to Stand: Supervision         General transfer comment: Supervision for safety with sit to stand from EOB x1, toilet x1. Good hand placement and technique.    Balance Overall balance assessment: Needs  assistance Sitting-balance support: Feet supported;No upper extremity supported Sitting balance-Leahy Scale: Good     Standing balance support: No upper extremity supported;During functional activity Standing balance-Leahy Scale: Fair                     ADL Overall ADL's : Needs assistance/impaired     Grooming: Min guard;Standing;Wash/dry hands           Upper Body Dressing : Set up;Sitting Upper Body Dressing Details (indicate cue type and reason): To don/doff hospital gown and back brace Lower Body Dressing: Maximal assistance Lower Body Dressing Details (indicate cue type and reason): To don/doff sock/shoe and CAM boot. Toilet Transfer: Min guard;Ambulation;BSC;RW   Toileting- ArchitectClothing Manipulation and Hygiene: Min guard;Sit to/from stand Toileting - Clothing Manipulation Details (indicate cue type and reason): for toilet hygiene and clothing manipulation     Functional mobility during ADLs: Min guard;Rolling walker General ADL Comments: Min guard for safety with functional mobility; quickly approaching supervision. Able to recall precautions and maintain during session.      Vision                     Perception     Praxis      Cognition   Behavior During Therapy: [facility]FL for tasks assessed/performed Overall Cognitive Status: Within Functional Limits for tasks assessed                       Extremity/Trunk Assessment               Exercises     Shoulder Instructions  General Comments      Pertinent Vitals/ Pain       Pain Assessment: No/denies pain Pain Score: 4  Pain Location: back and right foot Pain Descriptors / Indicators: Aching;Sore;Discomfort Pain Intervention(s): Limited activity within patient's tolerance;Monitored during session;Premedicated before session;Repositioned (used cam boot with gait on right foot)  Home Living                                          Prior  Functioning/Environment              Frequency Min 2X/week     Progress Toward Goals  OT Goals(current goals can now be found in the care plan section)  Progress towards OT goals: Progressing toward goals  Acute Rehab OT Goals Patient Stated Goal: to go home OT Goal Formulation: With patient  Plan Discharge plan remains appropriate    Co-evaluation                 End of Session Equipment Utilized During Treatment: Gait belt;Rolling walker;Back brace;Other (comment) (CAM boot)   Activity Tolerance Patient tolerated treatment well   Patient Left in bed;with call bell/phone within reach;with bed alarm set   Nurse Communication          Time: 1541-1600 OT Time Calculation (min): 19 min  Charges: OT General Charges $OT Visit: 1 Procedure OT Treatments $Self Care/Home Management : 8-22 mins  Gaye AlkenBailey A Larrissa Stivers M.S., OTR/L Pager: [phone]  01/15/2016, 4:17 PM

## 2016-01-16 LAB — GLUCOSE, CAPILLARY
GLUCOSE-CAPILLARY: 112 mg/dL — AB (ref 65–99)
Glucose-Capillary: 127 mg/dL — ABNORMAL HIGH (ref 65–99)

## 2016-01-16 MED ORDER — OXYCODONE-ACETAMINOPHEN 5-325 MG PO TABS: 1.0000 | ORAL_TABLET | ORAL | Status: DC | PRN

## 2016-01-16 NOTE — Progress Notes (Signed)
Discharge instructions reviewed with patient and daughter.  No further questions. Staff assisted patient to family vehicle.

## 2016-01-16 NOTE — Progress Notes (Signed)
Patient ID: Chelsea AmabileLinda S Bowman, female   DOB: 02/06/1941, 75 y.o.   MRN: [id number] Doing well no leg pain  Strength out of 5 wound clean dry and intact  She was fitted for the blue by orthopedics and is stable for discharge home.

## 2016-01-16 NOTE — Care Management Note (Signed)
Case Management Note  Patient Details  Name: Mal AmabileLinda S Manny MRN: [id number] Date of Birth: 06/05/1941  Subjective/Objective:                  Posterior lumbar interbody Fusion Lumbar one to lumbar two redo lumbar two-three fusion removal hardware- Lumbar two -lumbar five (N/A) Action/Plan: Discharge planning Expected Discharge Date:  01/16/16               Expected Discharge Plan:  Home w Home Health Services  In-House Referral:     Discharge planning Services  CM Consult  Post Acute Care Choice:  Home Health Choice offered to:  Patient  DME Arranged:  N/A DME Agency:  NA  HH Arranged:  PT HH Agency:  [facility]  Status of Service:  Completed, signed off  If discussed at Long Length of Stay Meetings, dates discussed:    Additional Comments: CM spoke with pt for choice of home health agency.  Pt chooses [facility] to render HHPT.  [facility]  Pt states she has both a rolling walker and a 3n1 at home and denies need for additional DMe.  No other CM needs were communicated. Yves DillJeffries, Idell Hissong Christine, RN 01/16/2016, 2:36 PM

## 2016-01-25 ENCOUNTER — Encounter: Payer: Self-pay | Admitting: *Deleted

## 2016-01-25 ENCOUNTER — Emergency Department (INDEPENDENT_AMBULATORY_CARE_PROVIDER_SITE_OTHER)
Admission: EM | Admit: 2016-01-25 | Discharge: 2016-01-25 | Disposition: A | Discharge status: Home / Self Care | Payer: Medicare HMO | Source: Home / Self Care | Attending: Family Medicine | Admitting: Family Medicine

## 2016-01-25 DIAGNOSIS — M545 Low back pain, unspecified: Secondary | ICD-10-CM

## 2016-01-25 DIAGNOSIS — M79671 Pain in right foot: Secondary | ICD-10-CM

## 2016-01-25 NOTE — ED Notes (Signed)
Pt c/o continued low back and RT foot pain post back sx on 01/12/16. She is taking oxycodone/ APAP 5/325mg  for pain.

## 2016-01-25 NOTE — Discharge Instructions (Signed)
° °  It is important to follow the instructions provided by your orthopedist and spinal surgeons closely to help ensure a quicker recovery.  Unfortunately, pain from surgery and tendon or ligament injuries could take several weeks to start to improve.  Please take the pain medication prescribed to you as directed on the bottle.  For your foot, you may find relief of pain with ice and elevation to help with the swelling.  Please call both of your specialists for further evaluation and to discuss the timeline of healing so you know what to expect during the healing process and the process of going through physical therapy.

## 2016-01-25 NOTE — ED Provider Notes (Signed)
CSN: [id number]     Arrival date & time 01/25/16  4098 History   First MD Initiated Contact with Patient 01/25/16 0840     Chief Complaint  Patient presents with  . Back Pain  . Foot Pain   (Consider location/radiation/quality/duration/timing/severity/associated sxs/prior Treatment) HPI Chelsea Bowman is a 75 y.o. female presenting to UC accompanied by her daughter, with c/o gradually worsening lower back pain and Right foot pain after having lower back surgery on 01/12/16.  Pt was discharged home on Sunday July 2nd.  During her hospital stay, she was advised she had torn/injured a tendon or ligament in her foot, dx by MRI. She was placed in a cam-walker boot and provided a walker.  She has been doing physical therapy, last time was on Friday, July 7th, which exacerbated her Right foot pain. She also notes the boot does not fit her well as it is tight on top of her foot but her ankle slides up and down while in the boot.  Pain was most severe last night, making it difficult for her to sleep. She has been taking percocet as prescribed but only minimal relief. Denies fever, chills, n/v/d, or urinary symptoms.  She has not called her orthopedist, Dr. Magnus Ivan, about her foot, or neurosurgeon, Dr. Wynetta Emery, about her back pain as she was unsure where to start. Denies new injuries since surgery.   Past Medical History  Diagnosis Date  . Diabetes mellitus without complication (HCC)     takes Metformin daily  . Hyperlipidemia     takes Lovastatin daily  . Environmental allergies     takes Claritin daily  . Depression     takes Prozac daily  . Hypertension     takes Coreg daily  . Hypothyroidism     takes Synthroid daily  . PONV (postoperative nausea and vomiting)   . CHF (congestive heart failure) (HCC)     takes Furosemide daily along with Potassium  . Pneumonia 11/2014  . Headache   . Weakness     numbness and tingling in hands and feet  . Peripheral neuropathy (HCC)   . Restless leg    takes Mirapex daily  . Chronic back pain     pseudoarthrosis  . Osteoporosis   . Arthritis   . Joint pain   . Joint swelling   . Hemorrhoids   . History of colon polyps     benign  . Urinary frequency   . Nocturia   . Sleep apnea   . Shortness of breath dyspnea     with exertion occasionally.Has Albuterol if needed  . Seizures (HCC)     had 2 but years and years ago.Was on Phenobarbital when she was 75yrs old  . GERD (gastroesophageal reflux disease)     watches what she eats   Past Surgical History  Procedure Laterality Date  . Cholecystectomy  80yrs ago  . Cervical fusion      anterior  . Back surgery      x 4  . Colonoscopy    . Vagina surgery  91yrs ago    unsure of name   . Lumbar puncture    . Posterior cervical fusion/foraminotomy N/A 07/05/2015    Procedure: Posterior Cervical Fusion with lateral mass fixation - Cervical two-three;  Surgeon: Donalee Citrin, MD;  Location: [facility];  Service: Neurosurgery;  Laterality: N/A;  . Posterior lumbar fusion  01/12/2016   History reviewed. No pertinent family history. Social History  Substance Use  Topics  . Smoking status: Never Smoker   . Smokeless tobacco: Never Used  . Alcohol Use: No   OB History    No data available     Review of Systems  Constitutional: Negative for fever and chills.  Gastrointestinal: Negative for nausea, vomiting and abdominal pain.  Genitourinary: Negative for dysuria, frequency and flank pain.  Musculoskeletal: Positive for myalgias, back pain and arthralgias. Negative for joint swelling.    Allergies  Dilantin; Latex; Gabapentin; Adhesive; and Codeine  Home Medications   Prior to Admission medications   Medication Sig Start Date End Date Taking? Authorizing Provider  acetaminophen (TYLENOL) 325 MG tablet Take 650 mg by mouth every 6 (six) hours as needed for mild pain.    Historical Provider, MD  albuterol (PROVENTIL HFA;VENTOLIN HFA) 108 (90 BASE) MCG/ACT inhaler Inhale 1-2 puffs  into the lungs every 6 (six) hours as needed for wheezing or shortness of breath.    Historical Provider, MD  aspirin 81 MG tablet Take 81 mg by mouth daily.    Historical Provider, MD  B Complex Vitamins (B COMPLEX 100 PO) Take 1 tablet by mouth daily.    Historical Provider, MD  carvedilol (COREG) 6.25 MG tablet Take 6.25 mg by mouth 2 (two) times daily with a meal.    Historical Provider, MD  cholecalciferol (VITAMIN D) 1000 UNITS tablet Take 1,000 Units by mouth daily.     Historical Provider, MD  diphenhydrAMINE (BENADRYL) 25 mg capsule Take 25 mg by mouth every 8 (eight) hours as needed for itching.    Historical Provider, MD  FLUoxetine (PROZAC) 40 MG capsule Take 40 mg by mouth daily.    Historical Provider, MD  furosemide (LASIX) 40 MG tablet Take 40 mg by mouth daily.    Historical Provider, MD  hydrocortisone (ANUSOL-HC) 25 MG suppository Place 1 suppository rectally 2 (two) times daily. 12/08/15   Historical Provider, MD  ibuprofen (ADVIL,MOTRIN) 200 MG tablet Take 400 mg by mouth every 8 (eight) hours as needed for mild pain or moderate pain.    Historical Provider, MD  levothyroxine (SYNTHROID, LEVOTHROID) 75 MCG tablet Take 75 mcg by mouth daily before breakfast.    Historical Provider, MD  loratadine (CLARITIN) 10 MG tablet Take 10 mg by mouth daily.    Historical Provider, MD  lovastatin (MEVACOR) 40 MG tablet Take 40 mg by mouth at bedtime.    Historical Provider, MD  meloxicam (MOBIC) 15 MG tablet Take 15 mg by mouth daily as needed for pain.    Historical Provider, MD  metFORMIN (GLUCOPHAGE) 500 MG tablet Take 500 mg by mouth daily with breakfast.     Historical Provider, MD  oxyCODONE-acetaminophen (PERCOCET/ROXICET) 5-325 MG tablet Take 1-2 tablets by mouth every 4 (four) hours as needed for moderate pain. 01/16/16   Donalee CitrinGary Cram, MD  potassium chloride (K-DUR) 10 MEQ tablet Take 10 mEq by mouth 2 (two) times daily.     Historical Provider, MD  pramipexole (MIRAPEX) 0.25 MG tablet  Take 0.75 mg by mouth at bedtime.     Historical Provider, MD   Meds Ordered and Administered this Visit  Medications - No data to display  BP 150/73 mmHg  Pulse 69  Temp(Src) 98.2 F (36.8 C) (Oral)  Resp 18  Ht 5\' 5"  (1.651 m)  Wt 200 lb (90.719 kg)  BMI 33.28 kg/m2  SpO2 96% No data found.   Physical Exam  Constitutional: She is oriented to person, place, and time. She appears well-developed and  well-nourished.  Pt sitting comfortably in exam chair, cam-walker boot on Right foot, back brace on. Walker in corner of room.   HENT:  Head: Normocephalic and atraumatic.  Eyes: EOM are normal.  Neck: Normal range of motion.  Cardiovascular: Normal rate.   Pulses:      Dorsalis pedis pulses are 2+ on the right side.       Posterior tibial pulses are 2+ on the right side.  Pulmonary/Chest: Effort normal.  Musculoskeletal: Normal range of motion. She exhibits tenderness. She exhibits no edema.  Right foot: full ROM, mild tenderness to outside aspect of great toe. No edema. Calf is soft, non-tender. Back brace in place. Full ROM upper and lower extremities.  Neurological: She is alert and oriented to person, place, and time.  Skin: Skin is warm and dry.  Right foot: skin in tach, no ecchymosis or erythema.   Psychiatric: She has a normal mood and affect. Her behavior is normal.  Nursing note and vitals reviewed.   ED Course  Procedures (including critical care time)  Labs Review Labs Reviewed - No data to display  Imaging Review No results found.    MDM   1. Right foot pain   2. Bilateral low back pain without sciatica    Pt c/o worsening Right foot and back pain, no new injuries.  Boot on Right foot does not fit properly.  Tenderness to Right great toe but no ecchymosis or erythema of foot. No indication for re-peat imaging as pt just had an MRI of foot.   No red flag symptoms with back pain.  Offered to change out boot, however, pt wants to check with hospital  that provided the boot to see if she can get a different size. Advised pt she is already on percocet, no stronger pain medication will be prescribed in UC.  Encouraged to call to f/u with Dr. Wynetta Emery, and/or Dr. Magnus Ivan about worsening pain. Patient and daughter verbalized understanding and agreement with treatment plan.    Junius Finner, PA-C 01/25/16 1011

## 2016-02-18 ENCOUNTER — Other Ambulatory Visit (HOSPITAL_BASED_OUTPATIENT_CLINIC_OR_DEPARTMENT_OTHER): Payer: Self-pay | Admitting: Neurosurgery

## 2016-02-18 ENCOUNTER — Ambulatory Visit (HOSPITAL_BASED_OUTPATIENT_CLINIC_OR_DEPARTMENT_OTHER)
Admission: RE | Admit: 2016-02-18 | Discharge: 2016-02-18 | Disposition: A | Discharge status: Home / Self Care | Payer: Medicare HMO | Source: Ambulatory Visit | Attending: Neurosurgery | Admitting: Neurosurgery

## 2016-02-18 DIAGNOSIS — M5137 Other intervertebral disc degeneration, lumbosacral region: Secondary | ICD-10-CM

## 2016-02-18 DIAGNOSIS — M51379 Other intervertebral disc degeneration, lumbosacral region without mention of lumbar back pain or lower extremity pain: Secondary | ICD-10-CM

## 2016-03-07 NOTE — Discharge Summary (Signed)
Physician Discharge Summary  Patient ID: Chelsea Bowman MRN: [id number] DOB/AGE: 73/10/1940 73 y.o.  Admit date: 01/12/2016 Discharge date: 03/07/2016  Admission Diagnoses:pseudoarthrosis L2-3 lumbar instability L1-L2  Discharge Diagnoses: same Active Problems:   Pseudoarthrosis of lumbar spine   Discharged Condition: good  Hospital Course: patient is a 75 year old female who is admitted with back pain from instability and pseudoarthrosis at L2-3 and stenosis at L1-L2.  Patient went to the operating underwent removal of hardware L3 to L5 redo redo lumbar fusion L2-3 and lumbar decompression fusion L1-L2.  Postop patient very well, 4 on the floor was doing very well had minimal back pain and no radicular symptoms had a lot of foot pain.  I can get an MRI scan consult with orthopedics and she was placed in a boot and was discharged.  Patient be discharged her scheduled follow-up in approximately 2 weeks.  Consults: Significant Diagnostic Studies: Treatments:removal of hardware L3 to L5 lumbar fusion L1 and L3 Discharge Exam: Blood pressure (!) 112/54, pulse 72, temperature 98 F (36.7 C), temperature source Oral, resp. rate 16, height 5\' 5"  (1.651 m), weight 95.5 kg (210 lb 8 oz), SpO2 97 %. Strength out of 5 wound clean dry and intact  Disposition: home  Discharge Instructions    Care order/instruction    Complete by:  As directed   D/c hemovac   Face-to-face encounter (required for Medicare/Medicaid patients)    Complete by:  As directed   I Saqib Cazarez P certify that this patient is under my care and that I, or a nurse practitioner or physician's assistant working with me, had a face-to-face encounter that meets the physician face-to-face encounter requirements with this patient on 01/16/2016. The encounter with the patient was in whole, or in part for the following medical condition(s) which is the primary reason for home health care (List medical condition): Lumbar spinal stenosis and  degenerative disc disease   The encounter with the patient was in whole, or in part, for the following medical condition, which is the primary reason for home health care:  Lumbar spinal stenosis and degenerative disc disease   I certify that, based on my findings, the following services are medically necessary home health services:  Physical therapy   Reason for Medically Necessary Home Health Services:  Therapy- Instruction on Safe use of Assistive Devices for ADLs   My clinical findings support the need for the above services:  Pain interferes with ambulation/mobility   Further, I certify that my clinical findings support that this patient is homebound due to:  Pain interferes with ambulation/mobility   Home Health    Complete by:  As directed   To provide the following care/treatments:  PT       Medication List    TAKE these medications   acetaminophen 325 MG tablet Commonly known as:  TYLENOL Take 650 mg by mouth every 6 (six) hours as needed for mild pain.   albuterol 108 (90 Base) MCG/ACT inhaler Commonly known as:  PROVENTIL HFA;VENTOLIN HFA Inhale 1-2 puffs into the lungs every 6 (six) hours as needed for wheezing or shortness of breath.   aspirin 81 MG tablet Take 81 mg by mouth daily.   B COMPLEX 100 PO Take 1 tablet by mouth daily.   carvedilol 6.25 MG tablet Commonly known as:  COREG Take 6.25 mg by mouth 2 (two) times daily with a meal.   cholecalciferol 1000 units tablet Commonly known as:  VITAMIN D Take 1,000 Units by mouth daily.  diphenhydrAMINE 25 mg capsule Commonly known as:  BENADRYL Take 25 mg by mouth every 8 (eight) hours as needed for itching.   FLUoxetine 40 MG capsule Commonly known as:  PROZAC Take 40 mg by mouth daily.   furosemide 40 MG tablet Commonly known as:  LASIX Take 40 mg by mouth daily.   hydrocortisone 25 MG suppository Commonly known as:  ANUSOL-HC Place 1 suppository rectally 2 (two) times daily.   ibuprofen 200 MG  tablet Commonly known as:  ADVIL,MOTRIN Take 400 mg by mouth every 8 (eight) hours as needed for mild pain or moderate pain.   levothyroxine 75 MCG tablet Commonly known as:  SYNTHROID, LEVOTHROID Take 75 mcg by mouth daily before breakfast.   loratadine 10 MG tablet Commonly known as:  CLARITIN Take 10 mg by mouth daily.   lovastatin 40 MG tablet Commonly known as:  MEVACOR Take 40 mg by mouth at bedtime.   meloxicam 15 MG tablet Commonly known as:  MOBIC Take 15 mg by mouth daily as needed for pain.   metFORMIN 500 MG tablet Commonly known as:  GLUCOPHAGE Take 500 mg by mouth daily with breakfast.   oxyCODONE-acetaminophen 5-325 MG tablet Commonly known as:  PERCOCET/ROXICET Take 1-2 tablets by mouth every 4 (four) hours as needed for moderate pain.   potassium chloride 10 MEQ tablet Commonly known as:  K-DUR Take 10 mEq by mouth 2 (two) times daily.   pramipexole 0.25 MG tablet Commonly known as:  MIRAPEX Take 0.75 mg by mouth at bedtime.      Follow-up Information    [facility].   Why:  home health physical therapy Contact information: [address] [place] [address] [phone]           Signed: Mariam DollarCRAM,Corinthia Helmers P 03/07/2016, 9:30 AM

## 2016-04-28 ENCOUNTER — Ambulatory Visit (HOSPITAL_BASED_OUTPATIENT_CLINIC_OR_DEPARTMENT_OTHER)
Admission: RE | Admit: 2016-04-28 | Discharge: 2016-04-28 | Disposition: A | Discharge status: Home / Self Care | Payer: Medicare HMO | Source: Ambulatory Visit | Attending: Neurosurgery | Admitting: Neurosurgery

## 2016-04-28 DIAGNOSIS — M5137 Other intervertebral disc degeneration, lumbosacral region: Secondary | ICD-10-CM

## 2016-04-28 DIAGNOSIS — Z981 Arthrodesis status: Secondary | ICD-10-CM | POA: Insufficient documentation

## 2016-04-28 DIAGNOSIS — M5136 Other intervertebral disc degeneration, lumbar region: Secondary | ICD-10-CM | POA: Diagnosis present

## 2016-07-24 ENCOUNTER — Ambulatory Visit (INDEPENDENT_AMBULATORY_CARE_PROVIDER_SITE_OTHER): Payer: Self-pay | Admitting: Physician Assistant

## 2016-07-31 ENCOUNTER — Encounter (INDEPENDENT_AMBULATORY_CARE_PROVIDER_SITE_OTHER): Payer: Self-pay | Admitting: Physician Assistant

## 2016-07-31 ENCOUNTER — Ambulatory Visit (INDEPENDENT_AMBULATORY_CARE_PROVIDER_SITE_OTHER): Payer: Medicare HMO | Admitting: Physician Assistant

## 2016-07-31 ENCOUNTER — Encounter (INDEPENDENT_AMBULATORY_CARE_PROVIDER_SITE_OTHER): Payer: Self-pay

## 2016-07-31 DIAGNOSIS — M7062 Trochanteric bursitis, left hip: Secondary | ICD-10-CM | POA: Diagnosis not present

## 2016-07-31 DIAGNOSIS — M25552 Pain in left hip: Secondary | ICD-10-CM | POA: Diagnosis not present

## 2016-07-31 MED ORDER — LIDOCAINE HCL 1 % IJ SOLN
3.0000 mL | INTRAMUSCULAR | Status: AC | PRN
  Administered 2016-07-31: 3 mL

## 2016-07-31 MED ORDER — METHYLPREDNISOLONE ACETATE 40 MG/ML IJ SUSP
40.0000 mg | INTRAMUSCULAR | Status: AC | PRN
  Administered 2016-07-31: 40 mg via INTRA_ARTICULAR

## 2016-07-31 NOTE — Progress Notes (Signed)
Office Visit Note   Patient: Chelsea Bowman           Date of Birth: 08/02/1940           MRN: [id number] Visit Date: 07/31/2016              Requested by: Olivia CanterWilliam Kelly, MD [address] [place], [place] [id number] PCP: Olivia CanterKELLY, WILLIAM, MD   Assessment & Plan: Visit Diagnoses:  1. Left hip pain   2. Trochanteric bursitis, left hip     Plan:   Iliotibial band stretching exercises are shown and I had the patient demonstrate these. Pain persist she'll follow up in 2 weeks for reevaluation left hip pain Follow-Up Instructions: Return in about 2 weeks (around 08/14/2016).   Orders:  Orders Placed This Encounter  Procedures  . Large Joint Injection/Arthrocentesis   No orders of the defined types were placed in this encounter.     Procedures: Large Joint Inj Date/Time: 07/31/2016 10:56 AM Performed by: Kirtland BouchardLARK, Mick Tanguma W Authorized by: Kirtland BouchardLARK, Mariselda Badalamenti W   Consent Given by:  Patient Indications:  Pain Location:  Hip Site:  L greater trochanter Needle Size:  22 G Needle Length:  1.5 inches Approach:  Lateral Ultrasound Guidance: No   Fluoroscopic Guidance: No   Arthrogram: No   Medications:  40 mg methylPREDNISolone acetate 40 MG/ML; 3 mL lidocaine 1 % Aspiration Attempted: No   Patient tolerance:  Patient tolerated the procedure well with no immediate complications     Clinical Data: No additional findings.   Subjective: Chief Complaint  Patient presents with  . Left Hip - Pain    HPI Chelsea Bowman comes in today with a new problem of the left hip pain. The pain is been present for the last 2 months. She did walk with a cane due to the pain. She denies any radicular symptoms down the left leg side of her "normal numbness tingling" that she has from her back. Pain is in the left buttocks region and around into the groin area. He has some pain in the right hip. Increased pain with walking.  She had [facility] at [facility] last month. AP pelvis and lateral view of the left  hip ,films  are unavailable but the report was read as left hip joint to be congruent space narrowing no acute fracture..  Review of Systems   Objective: Vital Signs: There were no vitals taken for this visit.  Physical Exam  Constitutional: She is oriented to person, place, and time. She appears well-developed and well-nourished.  Cardiovascular: Intact distal pulses.   Neurological: She is alert and oriented to person, place, and time.  Psychiatric: She has a normal mood and affect.    Ortho Exam Hips with has excellent range of motion of both hips without pain. Tenderness over the trochanteric region of both hips. 5 out of 5 strength throughout the lower extremities against resistance. No tenderness with palpation left knee good range of motion left knee without pain. Specialty Comments:  No specialty comments available.  Imaging: No results found.   PMFS History: Patient Active Problem List   Diagnosis Date Noted  . Pseudoarthrosis of lumbar spine 01/12/2016  . 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 Arthritis   . CHF (congestive heart failure) (HCC)    takes Furosemide daily along with Potassium  . Chronic back pain    pseudoarthrosis  . Depression    takes Prozac  daily  . Diabetes mellitus without complication (HCC)    takes Metformin daily  . Environmental allergies    takes Claritin daily  . GERD (gastroesophageal reflux disease)    watches what she eats  . Headache   . Hemorrhoids   . History of colon polyps    benign  . Hyperlipidemia    takes Lovastatin daily  . Hypertension    takes Coreg daily  . Hypothyroidism    takes Synthroid daily  . Joint pain   . Joint swelling   . Nocturia   . Osteoporosis   . Peripheral neuropathy (HCC)   . Pneumonia 11/2014  . PONV (postoperative nausea and vomiting)   . Restless leg    takes Mirapex daily  . Seizures (HCC)    had 2 but  years and years ago.Was on Phenobarbital when she was 76yrs old  . Shortness of breath dyspnea    with exertion occasionally.Has Albuterol if needed  . Sleep apnea   . Urinary frequency   . Weakness    numbness and tingling in hands and feet    No family history on file.  Past Surgical History:  Procedure Laterality Date  . BACK SURGERY     x 4  . CERVICAL FUSION     anterior  . CHOLECYSTECTOMY  44yrs ago  . COLONOSCOPY    . LUMBAR PUNCTURE    . POSTERIOR CERVICAL FUSION/FORAMINOTOMY N/A 07/05/2015   Procedure: Posterior Cervical Fusion with lateral mass fixation - Cervical two-three;  Surgeon: Donalee Citrin, MD;  Location: [facility];  Service: Neurosurgery;  Laterality: N/A;  . POSTERIOR LUMBAR FUSION  01/12/2016  . VAGINA SURGERY  30yrs ago   unsure of name    Social History   Occupational History  . Not on file.   Social History Main Topics  . Smoking status: Never Smoker  . Smokeless tobacco: Never Used  . Alcohol use No  . Drug use: No  . Sexual activity: Not on file

## 2016-08-03 ENCOUNTER — Other Ambulatory Visit (HOSPITAL_BASED_OUTPATIENT_CLINIC_OR_DEPARTMENT_OTHER): Payer: Self-pay | Admitting: Neurosurgery

## 2016-08-03 DIAGNOSIS — M5137 Other intervertebral disc degeneration, lumbosacral region: Secondary | ICD-10-CM

## 2016-08-14 ENCOUNTER — Ambulatory Visit (INDEPENDENT_AMBULATORY_CARE_PROVIDER_SITE_OTHER): Payer: Self-pay | Admitting: Orthopaedic Surgery

## 2016-10-27 ENCOUNTER — Ambulatory Visit (HOSPITAL_BASED_OUTPATIENT_CLINIC_OR_DEPARTMENT_OTHER)
Admission: RE | Admit: 2016-10-27 | Discharge: 2016-10-27 | Disposition: A | Discharge status: Home / Self Care | Payer: Medicare HMO | Source: Ambulatory Visit | Attending: Neurosurgery | Admitting: Neurosurgery

## 2016-10-27 DIAGNOSIS — M5137 Other intervertebral disc degeneration, lumbosacral region: Secondary | ICD-10-CM | POA: Diagnosis present

## 2017-04-28 IMAGING — MR MR FOOT*R* W/O CM
4 of 6 series · 19 of 40 positions shown · non-contrast
Comparison: Radiographs dated 01/13/2016

CLINICAL DATA: Right foot pain and swelling on the dorsum of the
foot.

EXAM:
MRI OF THE RIGHT FOREFOOT WITHOUT CONTRAST
TECHNIQUE: Multiplanar, multisequence MR imaging was performed. No intravenous
contrast was administered.

[Series 2: T2 fat-sat · coronal · 4.0mm · 0.27mm/px · 7 of 28 slices shown (1 of 3)]
[im 1/28]
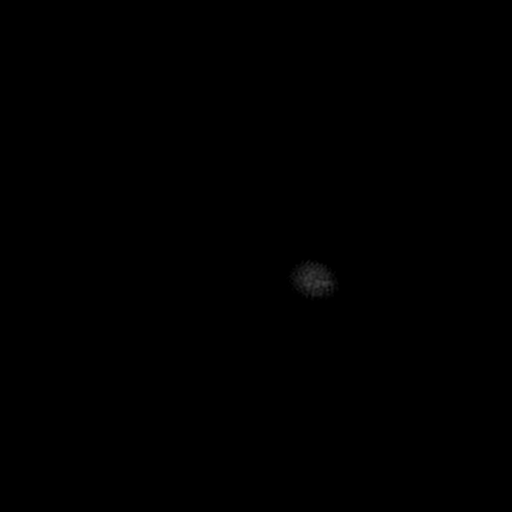
[im 5/28]
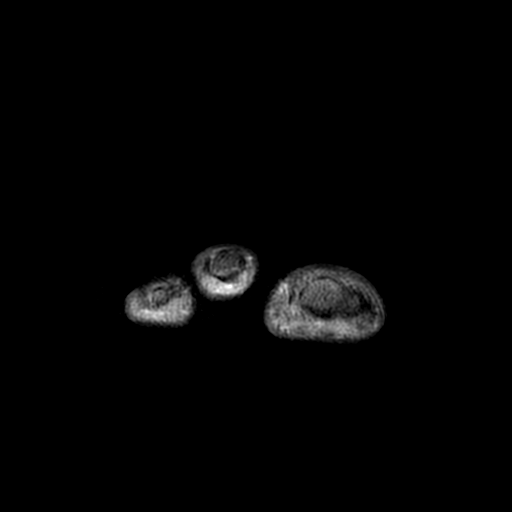
[im 10/28]
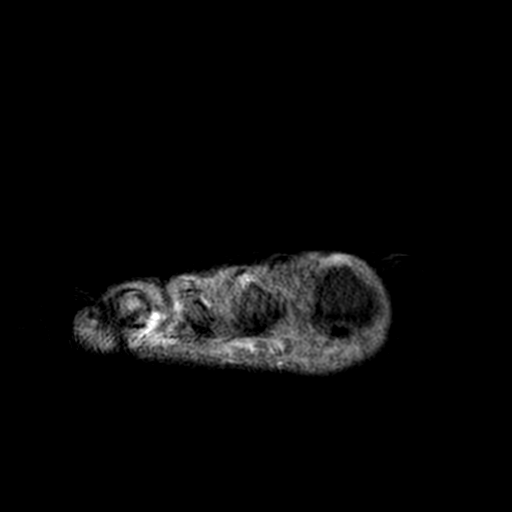
[im 14/28]
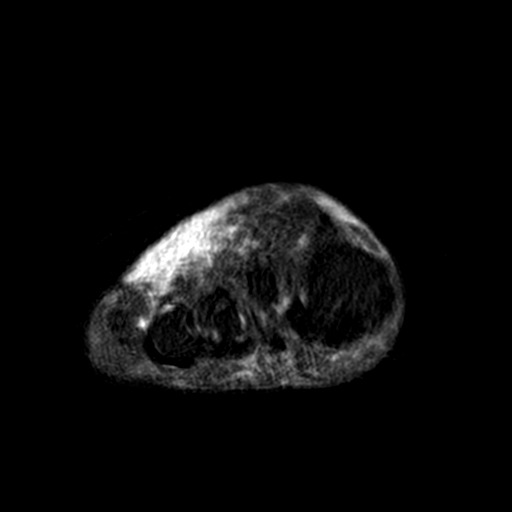
[im 19/28]
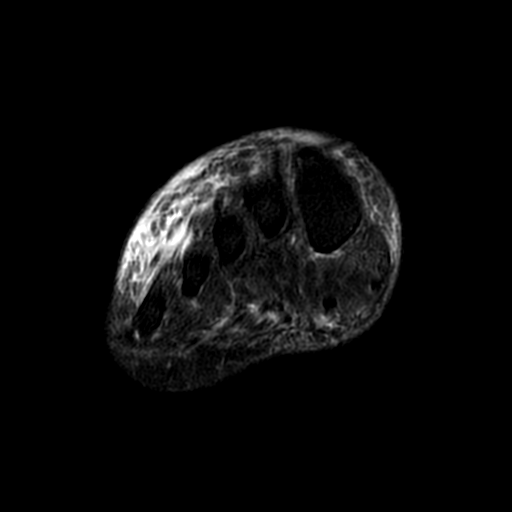
[im 23/28]
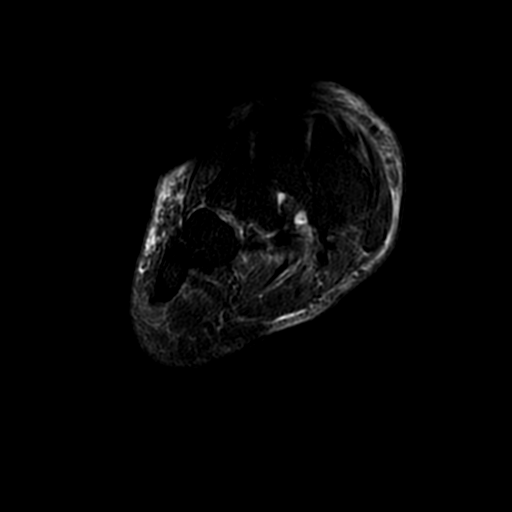
[im 28/28]
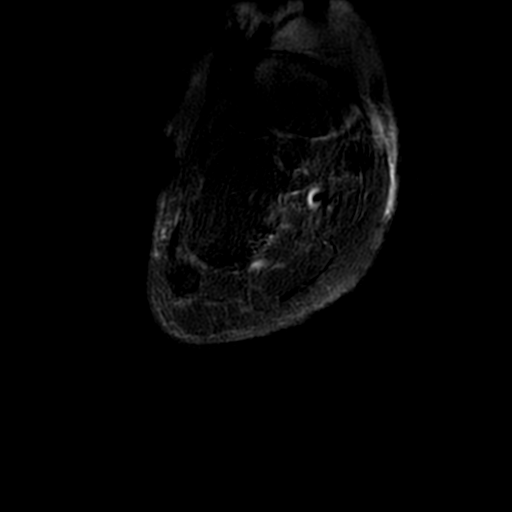

[Series 4: T1 · coronal · 4.0mm · 0.27mm/px · 3 of 28 slices shown]
[im 5/28]
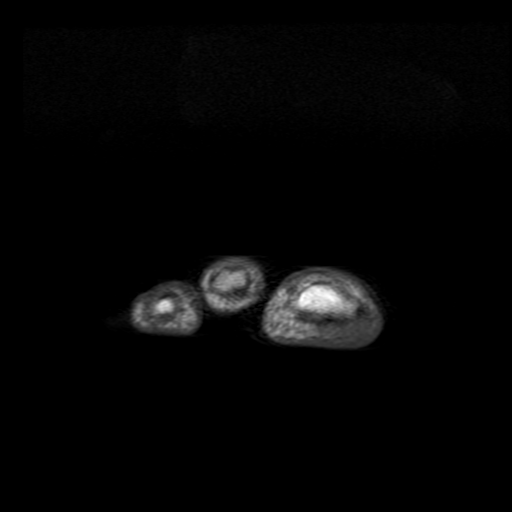
[im 14/28]
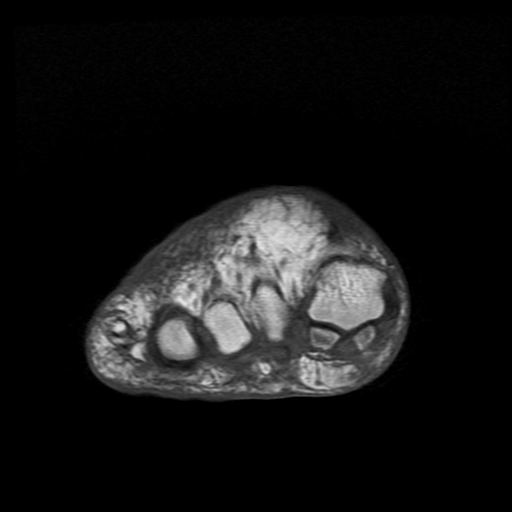
[im 23/28]
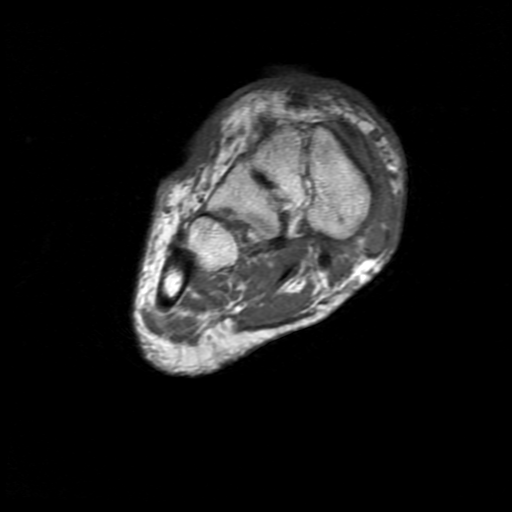

[Series 5: T2 fat-sat · axial · 3.0mm · 0.37mm/px · z∈[-136,-72]mm · 6 of 28 slices shown (2 of 3)]
[im 1/28]
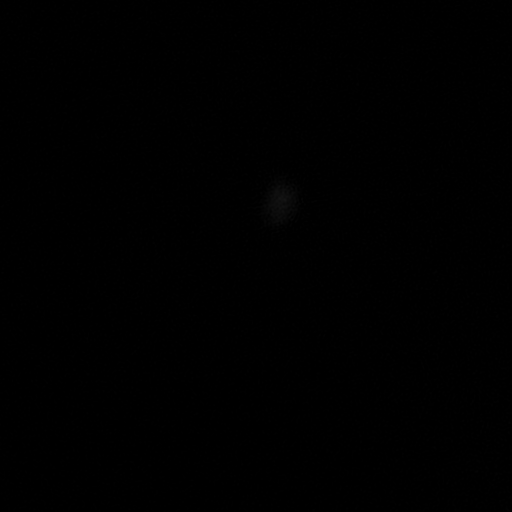
[im 5/28]
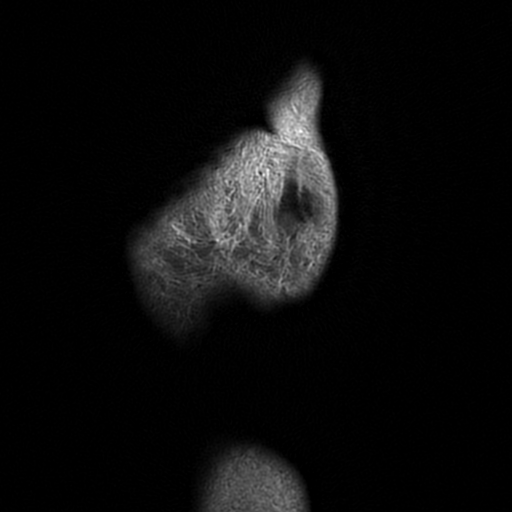
[im 10/28]
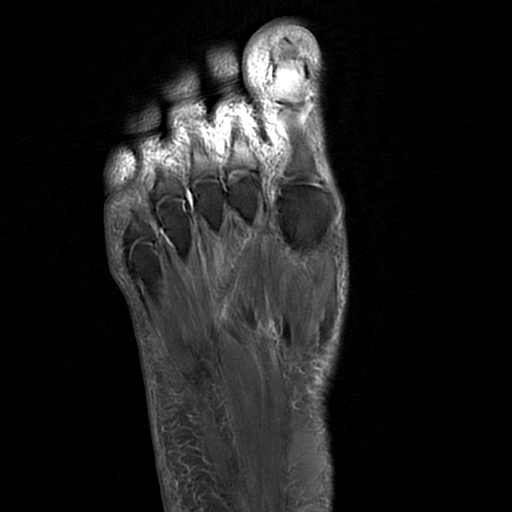
[im 14/28]
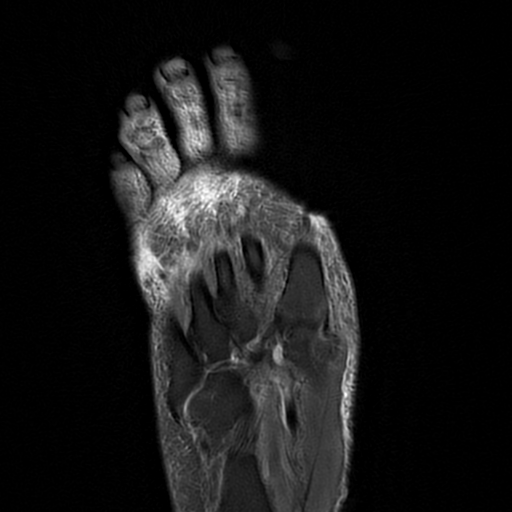
[im 19/28]
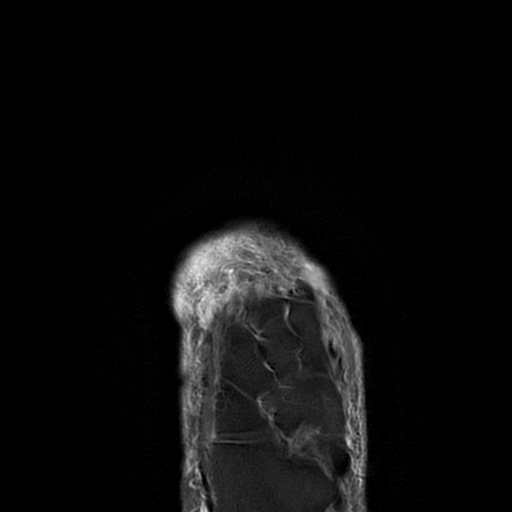
[im 23/28]
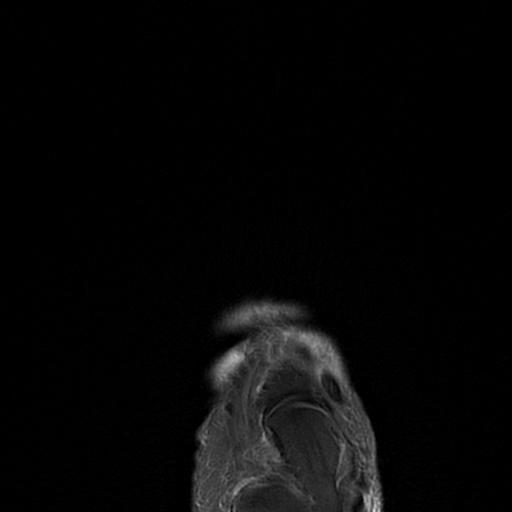

[Series 7: T2 fat-sat · sagittal · 3.0mm · 0.35mm/px · 3 of 28 slices shown (3 of 3)]
[im 5/28]
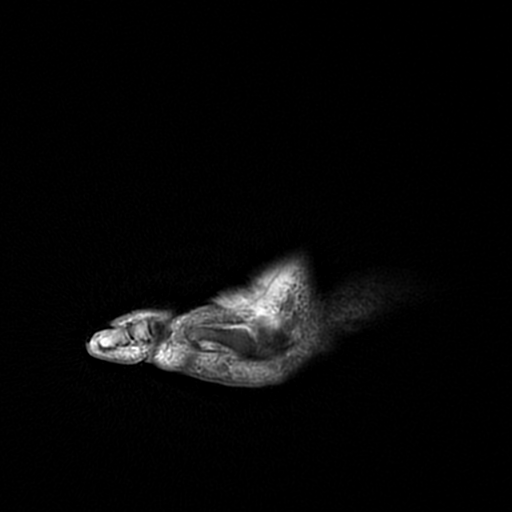
[im 14/28]
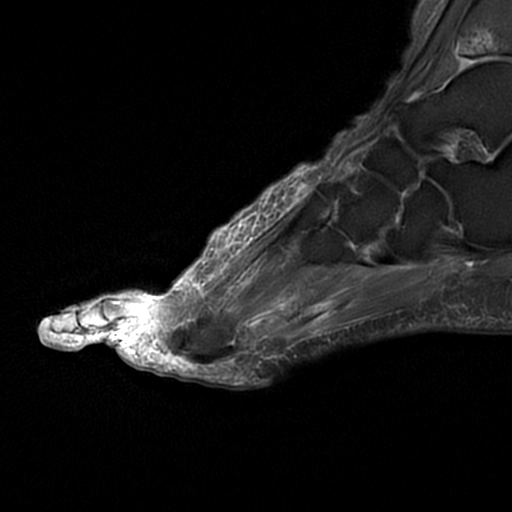
[im 23/28]
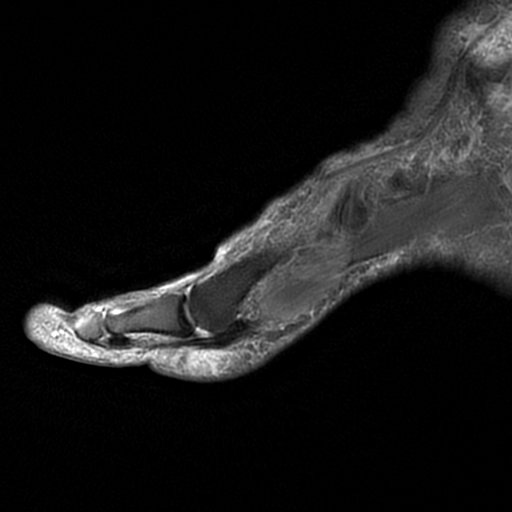

[19 of 40 positions shown; findings below may reference images not displayed]

FINDINGS: The patient has inflammation and a partial thickness longitudinal
tear of the superficial aspect of the distal tibialis anterior
tendon with some edema in the insertion of that tendon on the distal
lateral aspect of the medial cuneiform and the base of the first
metatarsal.

Focal arthritic changes at the anterior lateral aspect of the distal
tibia and dome of the talus. No other significant bone abnormality.
Tendons and muscles appear normal. No joint effusions.

Nonspecific soft tissue edema on the dorsum of the foot.
IMPRESSION: Partial thickness longitudinal tear of the distal tibialis anterior
tendon extending to the insertion on the medial cuneiform and base
of the first metatarsal. This is exactly the site of the patient's
maximum pain.

## 2017-09-11 ENCOUNTER — Ambulatory Visit: Payer: Medicare HMO | Admitting: Internal Medicine

## 2017-09-11 ENCOUNTER — Other Ambulatory Visit (INDEPENDENT_AMBULATORY_CARE_PROVIDER_SITE_OTHER): Payer: Self-pay

## 2017-09-11 ENCOUNTER — Ambulatory Visit (INDEPENDENT_AMBULATORY_CARE_PROVIDER_SITE_OTHER)
Admission: RE | Admit: 2017-09-11 | Discharge: 2017-09-11 | Disposition: A | Discharge status: Home / Self Care | Payer: Medicare HMO | Source: Ambulatory Visit | Attending: Internal Medicine | Admitting: Internal Medicine

## 2017-09-11 ENCOUNTER — Encounter: Payer: Self-pay | Admitting: Internal Medicine

## 2017-09-11 DIAGNOSIS — R0609 Other forms of dyspnea: Secondary | ICD-10-CM

## 2017-09-11 LAB — BASIC METABOLIC PANEL
BUN: 24 mg/dL — ABNORMAL HIGH (ref 6–23)
CO2: 31 mEq/L (ref 19–32)
Calcium: 9.8 mg/dL (ref 8.4–10.5)
Chloride: 99 mEq/L (ref 96–112)
Creatinine, Ser: 0.84 mg/dL (ref 0.40–1.20)
GFR: 70.29 mL/min (ref >60.00)
Glucose, Bld: 100 mg/dL — ABNORMAL HIGH (ref 70–99)
POTASSIUM: 4.3 meq/L (ref 3.5–5.1)
SODIUM: 135 meq/L (ref 135–145)

## 2017-09-11 LAB — BRAIN NATRIURETIC PEPTIDE: Pro B Natriuretic peptide (BNP): 136 pg/mL — ABNORMAL HIGH (ref 0.0–100.0)

## 2017-09-11 LAB — CBC WITH DIFFERENTIAL/PLATELET
BASOS ABS: 0 10*3/uL (ref 0.0–0.1)
Basophils Relative: 0.7 % (ref 0.0–3.0)
EOS ABS: 0.2 10*3/uL (ref 0.0–0.7)
Eosinophils Relative: 3.5 % (ref 0.0–5.0)
HCT: 40.2 % (ref 36.0–46.0)
Hemoglobin: 13.6 g/dL (ref 12.0–15.0)
LYMPHS ABS: 1.2 10*3/uL (ref 0.7–4.0)
Lymphocytes Relative: 17.9 % (ref 12.0–46.0)
MCHC: 33.8 g/dL (ref 30.0–36.0)
MCV: 92.2 fl (ref 78.0–100.0)
MONO ABS: 0.5 10*3/uL (ref 0.1–1.0)
Monocytes Relative: 8 % (ref 3.0–12.0)
NEUTROS ABS: 4.5 10*3/uL (ref 1.4–7.7)
NEUTROS PCT: 69.9 % (ref 43.0–77.0)
PLATELETS: 278 10*3/uL (ref 150.0–400.0)
RBC: 4.37 Mil/uL (ref 3.87–5.11)
RDW: 13.2 % (ref 11.5–15.5)
WBC: 6.5 10*3/uL (ref 4.0–10.5)

## 2017-09-11 LAB — TSH: TSH: 0.99 u[IU]/mL (ref 0.35–4.50)

## 2017-09-11 MED ORDER — FAMOTIDINE 20 MG PO TABS: ORAL_TABLET | ORAL | 2 refills | Status: AC

## 2017-09-11 MED ORDER — PANTOPRAZOLE SODIUM 40 MG PO TBEC: 40.0000 mg | DELAYED_RELEASE_TABLET | Freq: Every day | ORAL | 2 refills | Status: AC

## 2017-09-11 NOTE — Patient Instructions (Addendum)
Pantoprazole (protonix) 40 mg   Take  30-60 min before first meal of the day and Pepcid (famotidine)  20 mg one @  bedtime until return to office - this is the best way to tell whether stomach acid is contributing to your problem.     GERD (REFLUX)  is an extremely common cause of respiratory symptoms just like yours , many times with no obvious heartburn at all.    It can be treated with medication, but also with lifestyle changes including elevation of the head of your bed (ideally with 6 inch  bed blocks),  Smoking cessation, avoidance of late meals, excessive alcohol, and avoid fatty foods, chocolate, peppermint, colas, red wine, and acidic juices such as orange juice.  NO MINT OR MENTHOL PRODUCTS SO NO COUGH DROPS   USE SUGARLESS CANDY INSTEAD (Jolley ranchers or Stover's or Life Savers) or even ice chips will also do - the key is to swallow to prevent all throat clearing. NO OIL BASED VITAMINS - use powdered substitutes.     Please remember to go to the lab and x-ray department downstairs in the basement  for your tests - we will call you with the results when they are available.   Please schedule a follow up office visit in 6 weeks, call sooner if needed  Add full pfts on return

## 2017-09-11 NOTE — Assessment & Plan Note (Addendum)
09/11/2017   Walked RA  2 laps @ 185 ft each stopped due to  Back pain/ sob s desat - Spirometry 09/11/2017  FEV1 1.26 (56%)  Ratio 83 s  Curvature and nothing prior  - 09/11/2017 rec max rx for gerd then return for full pfts     Symptoms are markedly disproportionate to objective findings and not clear this is actually much of a  lung problem but pt does appear to have difficult to sort out respiratory symptoms of unknown origin for which  DDX  = almost all start with A and  include Adherence, Ace Inhibitors, Acid Reflux, Active Sinus Disease, Alpha 1 Antitripsin deficiency, Anxiety masquerading as Airways dz,  ABPA,  Allergy(esp in young), Aspiration (esp in elderly), Adverse effects of meds,  Active smokers, A bunch of PE's (a small clot burden can't cause this syndrome unless there is already severe underlying pulm or vascular dz with poor reserve).  Anemia or thyroid dz plus two Bs  = Bronchiectasis and Beta blocker use..and one C= CHF   Adherence is always the initial "prime suspect" and is a multilayered concern that requires a "trust but verify" approach in every patient - starting with knowing how to use medications, especially inhalers, correctly, keeping up with refills and understanding the fundamental difference between maintenance and prns vs those medications only taken for a very short course and then stopped and not refilled.  - return with all meds in hand using a trust but verify approach to confirm accurate Medication  Reconciliation The principal here is that until we are certain that the  patients are doing what we've asked, it makes no sense to ask them to do more.   ? Acid (or non-acid) GERD > always difficult to exclude as up to 75% of pts in some series report no assoc GI/ Heartburn symptoms> rec max (24h)  acid suppression and diet restrictions/ reviewed and instructions given in writing.   ? ? Anxiety related to obesity/ deconditioning  > usually at the bottom of this list of  usual suspects but should be much higher on this pt's based on H and P and note already on psychotropics .   ? Allergy/ asthma > note she only takes her saba p gets sob and per office notes is way over using it.  Will ask her to return with before and after pfts and only use the saba in the meantime if she can't catch her breath after resting, as she proved she could do today  ? Anemia/ thyroid dz rules out on today's labs  ? BB effects > probably ok on low doses of coreg   ? CHF > prev w/u in 2018 neg for IHD and bnp is minimally elevate so may have mild diastolic dysfunction but likely not contributing to her symptoms   Total time devoted to counseling  > 50 % of initial 60 min office visit:  review case with pt/ discussion of options/alternatives/ personally creating written customized instructions  in presence of pt  then going over those specific  Instructions directly with the pt including how to use all of the meds but in particular covering each new medication in detail and the difference between the maintenance= "automatic" meds and the prns using an action plan format for the latter (If this problem/symptom => do that organization reading Left to right).  Please see AVS from this visit for a full list of these instructions which I personally wrote for this pt and  are unique to this visit.

## 2017-09-11 NOTE — Progress Notes (Signed)
Subjective:     Patient ID: Chelsea Bowman, female   DOB: 04/07/1941,    MRN: [id number]  HPI  29  yowf never smoker with sob  ? Dx at [facility] chest ? 2014  "they said I was fine but needed albuterol"  since then sob variable   and   Always better p uses inhaler but note this is only if she stops doing what caused the sob in the first place and has gradually reduced her level of activity over the years, both to avoid sob but also multiple orthopedic issues so referred to pulmonary clinic 09/11/2017 by Florene Glen NP to sort out ? Sob     09/11/2017 1st [place] office visit/ Twan Harkin   Chief Complaint  Patient presents with  . Pulmonary Consult    Referred by Florene Glen, NP.  Pt c/o SOB since 2014.  She gets SOB with exertion and also when she lies flat on her back. She states she gets out of breath walking to her mailbox and back.    onset of sob x 4-5 years = doe more more limited by back /hips/knees  Can still do mb and back flat and most of time sob when returns and has never tried using her saba first/no assoc cough   breathing always fine at rest but immediately feels choked if tried to recline back beyond 45 degrees/ feels fine sleeping horizontal though with  L side down  Dogs in house o/w no exp  No obvious day to day or daytime variability or assoc excess/ purulent sputum or mucus plugs or hemoptysis or cp or chest tightness, subjective wheeze or overt sinus or hb symptoms. No unusual exposure hx or h/o childhood pna/ asthma or knowledge of premature birth.  Sleeping ok flat without nocturnal  or early am exacerbation  of respiratory  c/o's or need for noct saba. Also denies any obvious fluctuation of symptoms with weather or environmental changes or other aggravating or alleviating factors except as outlined above   Current Allergies, Complete Past Medical History, Past Surgical History, Family History, and Social History were reviewed in [employer]  record.  ROS  The following are not active complaints unless bolded Hoarseness, sore throat, dysphagia, dental problems, itching, sneezing,  nasal congestion or discharge of excess mucus or purulent secretions, ear ache,   fever, chills, sweats, unintended wt loss or wt gain, classically pleuritic or exertional cp,  orthopnea pnd or leg swelling, presyncope, palpitations, abdominal pain= feels gen bloated/ restricted chronically , anorexia, nausea, vomiting, diarrhea  or change in bowel habits or change in bladder habits, change in stools or change in urine, dysuria, hematuria,  rash, arthralgias, visual complaints, headache, numbness, weakness or ataxia or problems with walking or coordination,  change in mood/affect or memory.        Current Meds  Medication Sig  . albuterol (PROAIR HFA) 108 (90 Base) MCG/ACT inhaler Inhale 2 puffs into the lungs every 6 (six) hours as needed for wheezing or shortness of breath.  Marland Kitchen aspirin 81 MG tablet Take 81 mg by mouth daily.  . carvedilol (COREG) 6.25 MG tablet Take 6.25 mg by mouth 2 (two) times daily with a meal.  . cholecalciferol (VITAMIN D) 1000 UNITS tablet Take 1,000 Units by mouth daily.   . diphenhydrAMINE (BENADRYL) 25 mg capsule Take 25 mg by mouth every 8 (eight) hours as needed for itching.  Marland Kitchen FLUoxetine (PROZAC) 40 MG capsule Take 40 mg by mouth daily.  Marland Kitchen  furosemide (LASIX) 40 MG tablet Take 20 mg by mouth daily.   Marland Kitchen. ibuprofen (ADVIL,MOTRIN) 200 MG tablet Take 400 mg by mouth every 8 (eight) hours as needed for mild pain or moderate pain.  . Lactobacillus (FLORAJEN ACIDOPHILUS) CAPS Take 1 capsule by mouth daily.  Marland Kitchen. levothyroxine (SYNTHROID, LEVOTHROID) 75 MCG tablet Take 75 mcg by mouth daily before breakfast.  . loratadine (CLARITIN) 10 MG tablet Take 10 mg by mouth daily.  . magnesium oxide (MAG-OX) 400 MG tablet Take 400 mg by mouth daily.  . metFORMIN (GLUCOPHAGE) 500 MG tablet Take 500 mg by mouth daily with breakfast.   . pramipexole  (MIRAPEX) 0.25 MG tablet Take 0.75 mg by mouth at bedtime.   . pravastatin (PRAVACHOL) 40 MG tablet Take 40 mg by mouth daily.         Review of Systems     Objective:   Physical Exam    depressed obese amb wf nad (walks with cane)   Wt Readings from Last 3 Encounters:  09/11/17 207 lb (93.9 kg)  01/25/16 200 lb (90.7 kg)  01/12/16 210 lb 8 oz (95.5 kg)     Vital signs reviewed - Note on arrival 02 sats  96% on RA       HEENT: nl dentition, turbinates bilaterally, and oropharynx. Nl external ear canals without cough reflex   NECK :  without JVD/Nodes/TM/ nl carotid upstrokes bilaterally   LUNGS: no acc muscle use,  Nl contour chest which is clear to A and P bilaterally without cough on insp or exp maneuvers   CV:  RRR  no s3 or murmur or increase in P2, and no edema   ABD:  Tensely obese with limited inspiratory excursion in the supine position. No bruits or organomegaly appreciated, bowel sounds nl  MS:  Nl gait/ ext warm without deformities, calf tenderness, cyanosis or clubbing No obvious joint restrictions   SKIN: warm and dry without lesions    NEURO:  alert, approp, nl sensorium with  no motor or cerebellar deficits apparent.     CXR PA and Lateral:   09/11/2017 :    I personally reviewed images and agree with radiology impression as follows:    Streaky opacity at the lingula and left base may reflect subsegmental atelectasis, scarring, or less likely minimal infiltrate.    Labs ordered/ reviewed:      Chemistry      Component Value Date/Time   NA 135 09/11/2017 1217   K 4.3 09/11/2017 1217   CL 99 09/11/2017 1217   CO2 31 09/11/2017 1217   BUN 24 (H) 09/11/2017 1217   CREATININE 0.84 09/11/2017 1217      Component Value Date/Time   CALCIUM 9.8 09/11/2017 1217        Lab Results  Component Value Date   WBC 6.5 09/11/2017   HGB 13.6 09/11/2017   HCT 40.2 09/11/2017   MCV 92.2 09/11/2017   PLT 278.0 09/11/2017       EOS                        0.2  09/11/2017      Lab Results  Component Value Date   TSH 0.99 09/11/2017     Lab Results  Component Value Date   PROBNP 136.0 (H) 09/11/2017         Assessment:

## 2017-09-12 ENCOUNTER — Encounter: Payer: Self-pay | Admitting: Internal Medicine

## 2017-09-12 ENCOUNTER — Telehealth: Payer: Self-pay | Admitting: *Deleted

## 2017-09-12 DIAGNOSIS — R0609 Other forms of dyspnea: Principal | ICD-10-CM

## 2017-09-12 LAB — RESPIRATORY ALLERGY PROFILE REGION II ~~LOC~~
Allergen, A. alternata, m6: <0.1 kU/L
Allergen, Comm Silver Birch, t9: <0.1 kU/L
Allergen, Cottonwood, t14: <0.1 kU/L
Allergen, D pternoyssinus,d7: <0.1 kU/L
Allergen, Mouse Urine Protein, e78: <0.1 kU/L
Aspergillus fumigatus, m3: <0.1 kU/L
Bermuda Grass: <0.1 kU/L
Box Elder IgE: <0.1 kU/L
CLADOSPORIUM HERBARUM (M2) IGE: <0.1 kU/L
CLASS: 0
CLASS: 0
CLASS: 0
CLASS: 0
CLASS: 0
CLASS: 0
CLASS: 0
CLASS: 0
CLASS: 0
CLASS: 0
CLASS: 0
CLASS: 0
COMMON RAGWEED (SHORT) (W1) IGE: <0.1 kU/L
Class: 0
Class: 0
Class: 0
Class: 0
Class: 0
Class: 0
Class: 0
Class: 0
Class: 0
Class: 0
Class: 0
Class: 0
D. farinae: <0.1 kU/L
Elm IgE: <0.1 kU/L
IGE (IMMUNOGLOBULIN E), SERUM: 40 kU/L (ref <=114)
Johnson Grass: <0.1 kU/L
Pecan/Hickory Tree IgE: <0.1 kU/L
Rough Pigweed  IgE: <0.1 kU/L
Sheep Sorrel IgE: <0.1 kU/L

## 2017-09-12 LAB — INTERPRETATION:

## 2017-09-12 NOTE — Telephone Encounter (Signed)
-----   Message from Nyoka CowdenMichael B Wert, MD sent at 09/12/2017  7:20 AM EST ----- Needs pfts on return

## 2017-09-12 NOTE — Telephone Encounter (Signed)
LMTCB

## 2017-09-12 NOTE — Progress Notes (Signed)
Was able to talk to the patient regarding their results.  They verbalized an understanding of what was discussed. No further questions at this time. 

## 2017-09-18 NOTE — Telephone Encounter (Signed)
Spoke with the pt and appts were scheduled  Cancelled the one she had on 10/25/17

## 2017-10-23 ENCOUNTER — Ambulatory Visit: Payer: Self-pay | Admitting: Internal Medicine

## 2017-10-25 ENCOUNTER — Ambulatory Visit: Payer: Self-pay | Admitting: Internal Medicine

## 2017-11-01 ENCOUNTER — Encounter: Payer: Self-pay | Admitting: Internal Medicine

## 2017-11-01 ENCOUNTER — Ambulatory Visit: Payer: Medicare HMO | Admitting: Internal Medicine

## 2017-11-01 ENCOUNTER — Ambulatory Visit (INDEPENDENT_AMBULATORY_CARE_PROVIDER_SITE_OTHER): Payer: Medicare HMO | Admitting: Internal Medicine

## 2017-11-01 VITALS — BP 140/78 | HR 55 | Ht 65.0 in | Wt 208.0 lb

## 2017-11-01 DIAGNOSIS — R0609 Other forms of dyspnea: Secondary | ICD-10-CM | POA: Diagnosis not present

## 2017-11-01 LAB — PULMONARY FUNCTION TEST
DL/VA % pred: 111 %
DL/VA: 5.48 ml/min/mmHg/L
DLCO UNC % PRED: 68 %
DLCO cor % pred: 68 %
DLCO cor: 17.47 ml/min/mmHg
DLCO unc: 17.57 ml/min/mmHg
FEF 25-75 PRE: 1.87 L/s
FEF 25-75 Post: 2.04 L/sec
FEF2575-%CHANGE-POST: 9 %
FEF2575-%Pred-Post: 118 %
FEF2575-%Pred-Pre: 108 %
FEV1-%Change-Post: 0 %
FEV1-%PRED-PRE: 67 %
FEV1-%Pred-Post: 68 %
FEV1-POST: 1.5 L
FEV1-Pre: 1.49 L
FEV1FVC-%CHANGE-POST: 3 %
FEV1FVC-%Pred-Pre: 112 %
FEV6-%CHANGE-POST: -3 %
FEV6-%PRED-PRE: 61 %
FEV6-%Pred-Post: 59 %
FEV6-POST: 1.67 L
FEV6-PRE: 1.73 L
FEV6FVC-%Change-Post: 0 %
FEV6FVC-%PRED-POST: 102 %
FEV6FVC-%PRED-PRE: 103 %
FVC-%Change-Post: -2 %
FVC-%PRED-PRE: 60 %
FVC-%Pred-Post: 58 %
FVC-POST: 1.72 L
FVC-Pre: 1.77 L
POST FEV6/FVC RATIO: 97 %
Post FEV1/FVC ratio: 87 %
Pre FEV1/FVC ratio: 85 %
Pre FEV6/FVC Ratio: 98 %

## 2017-11-01 NOTE — Progress Notes (Signed)
Subjective:     Patient ID: Chelsea Bowman, female   DOB: 02/07/1941,    MRN: [id number]   Brief patient profile:  [id number]  yowf never smoker with sob  ? [facility]  "they said I was fine but needed albuterol"  since then sob variable   and   Always better p uses inhaler but note this is only if she stops doing what caused the sob in the first place and has gradually reduced her level of activity over the years, both to avoid sob but also multiple orthopedic issues so referred to pulmonary clinic 09/11/2017 by Florene GlenKatie Wingate NP to sort out ? Sob    History of Present Illness  09/11/2017 1st [place] [facility] office visit/ Chelsea Bowman   Chief Complaint  Patient presents with  . Pulmonary Consult    Referred by Florene GlenKatie Wingate, NP.  Pt c/o SOB since 2014.  She gets SOB with exertion and also when she lies flat on her back. She states she gets out of breath walking to her mailbox and back.    onset of sob x 4-5 years = doe more more limited by back /hips/knees  Can still do mb and back flat and most of time sob when returns and has never tried using her saba first/no assoc cough  breathing always fine at rest but immediately feels choked if tried to recline back beyond 45 degrees/ feels fine sleeping horizontal though with  L side down  Dogs in house o/w no exp rec Pantoprazole (protonix) 40 mg   Take  30-60 min before first meal of the day and Pepcid (famotidine)  20 mg one @  bedtime until return to office - this is the best way to tell whether stomach acid is contributing to your problem.   GERD diet    11/01/2017  f/u ov/Lenord Fralix re: doe  Chief Complaint  Patient presents with  . Follow-up    PFT's done today. Breathing is doing well. She has her albuterol, but has not had to use it.    Dyspnea:  mb and back is better and not using saba now Cough: none Sleep: horizontally either side - has not tried to lie on back SABA use:  None   No obvious day to day or daytime variability or assoc excess/  purulent sputum or mucus plugs or hemoptysis or cp or chest tightness, subjective wheeze or overt sinus or hb symptoms. No unusual exposure hx or h/o childhood pna/ asthma or knowledge of premature birth.  Sleeping  Flat surface without nocturnal  or early am exacerbation  of respiratory  c/o's or need for noct saba. Also denies any obvious fluctuation of symptoms with weather or environmental changes or other aggravating or alleviating factors except as outlined above   Current Allergies, Complete Past Medical History, Past Surgical History, Family History, and Social History were reviewed in [facility] electronic medical record.  ROS  The following are not active complaints unless bolded Hoarseness, sore throat, dysphagia, dental problems, itching, sneezing,  nasal congestion or discharge of excess mucus or purulent secretions, ear ache,   fever, chills, sweats, unintended wt loss or wt gain, classically pleuritic or exertional cp,  orthopnea pnd or arm/hand swelling  or leg swelling, presyncope, palpitations, abdominal pain, anorexia, nausea, vomiting, diarrhea  or change in bowel habits or change in bladder habits, change in stools or change in urine, dysuria, hematuria,  rash, arthralgias, visual complaints, headache, numbness, weakness or ataxia or  problems with walking or coordination,  change in mood or  memory.        Current Meds  Medication Sig  . albuterol (PROAIR HFA) 108 (90 Base) MCG/ACT inhaler Inhale 2 puffs into the lungs every 6 (six) hours as needed for wheezing or shortness of breath.  Marland Kitchen aspirin 81 MG tablet Take 81 mg by mouth daily.  . carvedilol (COREG) 6.25 MG tablet Take 6.25 mg by mouth 2 (two) times daily with a meal.  . cholecalciferol (VITAMIN D) 1000 UNITS tablet Take 1,000 Units by mouth daily.   . diphenhydrAMINE (BENADRYL) 25 mg capsule Take 25 mg by mouth every 8 (eight) hours as needed for itching.  . famotidine (PEPCID) 20 MG tablet One at bedtime  .  FLUoxetine (PROZAC) 40 MG capsule Take 40 mg by mouth daily.  . furosemide (LASIX) 40 MG tablet Take 20 mg by mouth every other day.   . ibuprofen (ADVIL,MOTRIN) 200 MG tablet Take 400 mg by mouth every 8 (eight) hours as needed for mild pain or moderate pain.  . Lactobacillus (FLORAJEN ACIDOPHILUS) CAPS Take 1 capsule by mouth daily.  Marland Kitchen levothyroxine (SYNTHROID, LEVOTHROID) 75 MCG tablet Take 75 mcg by mouth daily before breakfast.  . loratadine (CLARITIN) 10 MG tablet Take 10 mg by mouth daily.  . magnesium oxide (MAG-OX) 400 MG tablet Take 400 mg by mouth daily.  . metFORMIN (GLUCOPHAGE) 500 MG tablet Take 500 mg by mouth daily with breakfast.   . pantoprazole (PROTONIX) 40 MG tablet Take 1 tablet (40 mg total) by mouth daily. Take 30-60 min before first meal of the day  . pramipexole (MIRAPEX) 0.25 MG tablet Take 0.75 mg by mouth at bedtime.   . pravastatin (PRAVACHOL) 40 MG tablet Take 40 mg by mouth daily.                 Objective:   Physical Exam  amb very pleasant  obese wf nad    11/01/2017       208   09/11/17 207 lb (93.9 kg)  01/25/16 200 lb (90.7 kg)  01/12/16 210 lb 8 oz (95.5 kg)      Vital signs reviewed - Note on arrival 02 sats  94% on RA       HEENT: nl dentition, turbinates bilaterally, and oropharynx. Nl external ear canals without cough reflex   NECK :  without JVD/Nodes/TM/ nl carotid upstrokes bilaterally   LUNGS: no acc muscle use,  Nl contour chest which is clear to A and P bilaterally without cough on insp or exp maneuvers   CV:  RRR  no s3 or murmur or increase in P2, and no edema   ABD:  Quite  obese but nontender with limited inspiratory excursion in the supine position. No bruits or organomegaly appreciated, bowel sounds nl  MS:  Nl gait/ ext warm without deformities, calf tenderness, cyanosis or clubbing No obvious joint restrictions   SKIN: warm and dry without lesions    NEURO:  alert, approp, nl sensorium with  no motor or  cerebellar deficits apparent.               Assessment:

## 2017-11-01 NOTE — Patient Instructions (Signed)
Weight control is simply a matter of calorie balance which needs to be tilted in your favor by eating less and exercising more.  To get the most out of exercise, you need to be continuously aware that you are short of breath, but never out of breath, for 30 minutes daily. As you improve, it will actually be easier for you to do the same amount of exercise  in  30 minutes so always push to the level where you are short of breath.  If this does not result in gradual weight reduction then I strongly recommend you see a nutritionist with a food diary x 2 weeks so that we can work out a negative calorie balance which is universally effective in steady weight loss programs.  Think of your calorie balance like you do your bank account where in this case you want the balance to go down so you must take in less calories than you burn up.  It's just that simple:  Hard to do, but easy to understand.  Good luck!     If you are satisfied with your treatment plan,  let your doctor know and he/she can either refill your medications or you can return here when your prescription runs out.     If in any way you are not 100% satisfied,  please tell us.  If 100% better, tell your friends!  Pulmonary follow up is as needed   

## 2017-11-01 NOTE — Progress Notes (Signed)
PFT completed today.  

## 2017-11-03 ENCOUNTER — Encounter: Payer: Self-pay | Admitting: Internal Medicine

## 2017-11-03 NOTE — Assessment & Plan Note (Signed)
09/11/2017   Walked RA  2 laps @ 185 ft each stopped due to  Back pain/ sob s desat - Spirometry 09/11/2017  FEV1 1.26 (56%)  Ratio 83 s  Curvature and nothing prior  - 09/11/2017 rec max rx for gerd then return for full pfts   - Allergy profile 09/11/17 >  Eos 0.2 /  IgE  40 and RAST neg  - PFT's  11/01/2017  FEV1 1.50 (68 % ) ratio 87  p 0 % improvement from saba p nothing prior to study with DLCO  68 % corrects to 111 % for alv volume  And ERV  28%   I had an extended final summary discussion with the patient reviewing all relevant studies completed to date and  lasting 15 to 20 minutes of a 25 minute visit on the following issues:   Symptoms improved with gerd rx so would complete 52month and then stop and see if they worsen and if so consider refer to GI  Main issue with both gerd potential and low erv = wt (see separate a/p)   Each maintenance medication was reviewed in detail including most importantly the difference between maintenance and as needed and under what circumstances the prns are to be used.  Please see AVS for specific  Instructions which are unique to this visit and I personally typed out  which were reviewed in detail in writing with the patient and a copy provided.    Pulmonary f/u is prn    .

## 2017-11-03 NOTE — Assessment & Plan Note (Signed)
Body mass index is 34.61 kg/m.  -  trending : no significant change Lab Results  Component Value Date   TSH 0.99 09/11/2017     Contributing to gerd risk/ doe/reviewed the need and the process to achieve and maintain neg calorie balance > defer f/u primary care including intermittently monitoring thyroid status

## 2017-12-11 ENCOUNTER — Other Ambulatory Visit: Payer: Self-pay | Admitting: Student

## 2017-12-11 ENCOUNTER — Telehealth: Payer: Self-pay | Admitting: Nurse Practitioner

## 2017-12-11 DIAGNOSIS — M5416 Radiculopathy, lumbar region: Secondary | ICD-10-CM

## 2017-12-11 NOTE — Telephone Encounter (Signed)
Phone call to patient to verify medication list for myelogram procedure. Pt informed to stop prozac 48hrs prior to myelogram appointment time. Pt verbalized understanding.

## 2017-12-18 ENCOUNTER — Ambulatory Visit
Admission: RE | Admit: 2017-12-18 | Discharge: 2017-12-18 | Disposition: A | Discharge status: Home / Self Care | Payer: Medicare HMO | Source: Ambulatory Visit | Attending: Student | Admitting: Student

## 2017-12-18 DIAGNOSIS — M5416 Radiculopathy, lumbar region: Secondary | ICD-10-CM

## 2017-12-18 MED ORDER — IOPAMIDOL (ISOVUE-M 200) INJECTION 41%
15.0000 mL | Freq: Once | INTRAMUSCULAR | Status: AC
  Administered 2017-12-18: 15 mL via INTRATHECAL

## 2017-12-18 MED ORDER — DIAZEPAM 5 MG PO TABS
5.0000 mg | ORAL_TABLET | Freq: Once | ORAL | Status: AC
  Administered 2017-12-18: 5 mg via ORAL

## 2017-12-18 NOTE — Discharge Instructions (Signed)

## 2017-12-19 ENCOUNTER — Other Ambulatory Visit: Payer: Self-pay

## 2017-12-31 ENCOUNTER — Other Ambulatory Visit: Payer: Self-pay | Admitting: Student

## 2017-12-31 DIAGNOSIS — M5416 Radiculopathy, lumbar region: Secondary | ICD-10-CM

## 2018-01-11 ENCOUNTER — Ambulatory Visit
Admission: RE | Admit: 2018-01-11 | Discharge: 2018-01-11 | Disposition: A | Discharge status: Home / Self Care | Payer: Medicare HMO | Source: Ambulatory Visit | Attending: Student | Admitting: Student

## 2018-01-11 DIAGNOSIS — M5416 Radiculopathy, lumbar region: Secondary | ICD-10-CM

## 2018-01-11 MED ORDER — METHYLPREDNISOLONE ACETATE 40 MG/ML INJ SUSP (RADIOLOG
120.0000 mg | Freq: Once | INTRAMUSCULAR | Status: AC
  Administered 2018-01-11: 120 mg via EPIDURAL

## 2018-01-11 MED ORDER — IOPAMIDOL (ISOVUE-M 200) INJECTION 41%
1.0000 mL | Freq: Once | INTRAMUSCULAR | Status: AC
  Administered 2018-01-11: 1 mL via EPIDURAL

## 2018-01-11 NOTE — Discharge Instructions (Signed)

## 2018-02-11 ENCOUNTER — Encounter (INDEPENDENT_AMBULATORY_CARE_PROVIDER_SITE_OTHER): Payer: Self-pay | Admitting: Physician Assistant

## 2018-02-11 ENCOUNTER — Ambulatory Visit (INDEPENDENT_AMBULATORY_CARE_PROVIDER_SITE_OTHER): Payer: Medicare HMO

## 2018-02-11 ENCOUNTER — Ambulatory Visit (INDEPENDENT_AMBULATORY_CARE_PROVIDER_SITE_OTHER): Payer: Medicare HMO | Admitting: Physician Assistant

## 2018-02-11 DIAGNOSIS — M7061 Trochanteric bursitis, right hip: Secondary | ICD-10-CM

## 2018-02-11 DIAGNOSIS — M25551 Pain in right hip: Secondary | ICD-10-CM

## 2018-02-11 DIAGNOSIS — M25552 Pain in left hip: Secondary | ICD-10-CM

## 2018-02-11 MED ORDER — METHYLPREDNISOLONE ACETATE 40 MG/ML IJ SUSP
40.0000 mg | INTRAMUSCULAR | Status: AC | PRN
  Administered 2018-02-11: 40 mg via INTRA_ARTICULAR

## 2018-02-11 MED ORDER — LIDOCAINE HCL 1 % IJ SOLN
3.0000 mL | INTRAMUSCULAR | Status: AC | PRN
  Administered 2018-02-11: 3 mL

## 2018-02-11 NOTE — Progress Notes (Signed)
Office Visit Note   Patient: Chelsea Bowman           Date of Birth: 07/06/1941           MRN: [id number] Visit Date: 02/11/2018              Requested by: Chelsea Bowman, Katie, NP [address] [place], [place] [id number] PCP: Chelsea Bowman, Katie, NP   Assessment & Plan: Visit Diagnoses:  1. Pain in right hip   2. Left hip pain     Plan: She will work on IT band stretching for the right hip.  See her back in 2 weeks check her progress lack of.  If her pain hip continues may consider MRI to rule out osteoarthritis source of her hip pain.  Sessions were encouraged and answered.  She is to monitor her glucose levels closely over the next few days.  Follow-Up Instructions: Return in about 2 weeks (around 02/25/2018).   Orders:  Orders Placed This Encounter  Procedures  . Large Joint Inj  . XR HIP UNILAT W OR W/O PELVIS 2-3 VIEWS RIGHT   No orders of the defined types were placed in this encounter.     Procedures: Large Joint Inj: R greater trochanter on 02/11/2018 11:59 AM Indications: pain Details: 22 G 1.5 in needle, lateral approach  Arthrogram: No  Medications: 3 mL lidocaine 1 %; 40 mg methylPREDNISolone acetate 40 MG/ML Outcome: tolerated well, no immediate complications Procedure, treatment alternatives, risks and benefits explained, specific risks discussed. Consent was given by the patient. Immediately prior to procedure a time out was called to verify the correct patient, procedure, equipment, support staff and site/side marked as required. Patient was prepped and draped in the usual sterile fashion.       Clinical Data: No additional findings.   Subjective: Chief Complaint  Patient presents with  . Right Hip - Pain    HPI Chelsea Bowman returns today due to right hip and groin pain.  No known injury.  She denies any radicular symptoms.  She is does have some numbness and tingling in her feet due to neuropathy.  She has balance issues due to neuropathy and uses a  cane.  She is at treatment by Dr. Wynetta Emeryram for her lumbar spine.  She has had undergone epidural injections in lumbar spine due to the pain that she is having low back and right hip without any real relief.  Pain goes away when she is lying down is worse whenever she is ambulating. Review of Systems Please see HPI otherwise negative  Objective: Vital Signs: There were no vitals taken for this visit.  Physical Exam  Constitutional: She is oriented to person, place, and time. She appears well-developed and well-nourished. No distress.  Pulmonary/Chest: Effort normal.  Neurological: She is alert and oriented to person, place, and time.  Skin: She is not diaphoretic.  Psychiatric: She has a normal mood and affect.    Ortho Exam Bilateral hips good range of motion.  She has tenderness over the lateral right hip trochanteric region.  Left hip good range of motion without pain.  Very minimal tenderness over the left trochanteric region. Specialty Comments:  No specialty comments available.  Imaging: Xr Hip Unilat W Or W/o Pelvis 2-3 Views Right  Result Date: 02/11/2018 Right hip and AP pelvis.  No acute fractures.  Bilateral hips well located.  Slight sclerotic changes of the right hip compared to left.    PMFS History: Patient Active Problem List  Diagnosis Date Noted  . Morbid obesity due to excess calories (HCC) complicated by dm/ hyperlipidemia/hbp 11/03/2017  . DOE (dyspnea on exertion) 09/11/2017  . Pseudoarthrosis of lumbar spine 01/12/2016  . 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 Arthritis   . CHF (congestive heart failure) (HCC)    takes Furosemide daily along with Potassium  . Chronic back pain    pseudoarthrosis  . Depression    takes Prozac daily  . Diabetes mellitus without complication (HCC)    takes Metformin daily  . Environmental allergies    takes Claritin daily  . GERD  (gastroesophageal reflux disease)    watches what she eats  . Headache   . Hemorrhoids   . History of colon polyps    benign  . Hyperlipidemia    takes Lovastatin daily  . Hypertension    takes Coreg daily  . Hypothyroidism    takes Synthroid daily  . Joint pain   . Joint swelling   . Nocturia   . Osteoporosis   . Peripheral neuropathy   . Pneumonia 11/2014  . PONV (postoperative nausea and vomiting)   . Restless leg    takes Mirapex daily  . Seizures (HCC)    had 2 but years and years ago.Was on Phenobarbital when she was 77yrs old  . Shortness of breath dyspnea    with exertion occasionally.Has Albuterol if needed  . Sleep apnea   . Urinary frequency   . Weakness    numbness and tingling in hands and feet    History reviewed. No pertinent family history.  Past Surgical History:  Procedure Laterality Date  . BACK SURGERY     x 4  . CERVICAL FUSION     anterior  . CHOLECYSTECTOMY  48yrs ago  . COLONOSCOPY    . LUMBAR PUNCTURE    . POSTERIOR CERVICAL FUSION/FORAMINOTOMY N/A 07/05/2015   Procedure: Posterior Cervical Fusion with lateral mass fixation - Cervical two-three;  Surgeon: Chelsea Citrin, MD;  Location: [facility];  Service: Neurosurgery;  Laterality: N/A;  . POSTERIOR LUMBAR FUSION  01/12/2016  . VAGINA SURGERY  69yrs ago   unsure of name    Social History   Occupational History  . Not on file  Tobacco Use  . Smoking status: Never Smoker  . Smokeless tobacco: Never Used  Substance and Sexual Activity  . Alcohol use: No  . Drug use: No  . Sexual activity: Not on file

## 2018-02-25 ENCOUNTER — Encounter (INDEPENDENT_AMBULATORY_CARE_PROVIDER_SITE_OTHER): Payer: Self-pay | Admitting: Physician Assistant

## 2018-02-25 ENCOUNTER — Other Ambulatory Visit (INDEPENDENT_AMBULATORY_CARE_PROVIDER_SITE_OTHER): Payer: Self-pay

## 2018-02-25 ENCOUNTER — Ambulatory Visit (INDEPENDENT_AMBULATORY_CARE_PROVIDER_SITE_OTHER): Payer: Medicare HMO | Admitting: Physician Assistant

## 2018-02-25 DIAGNOSIS — M25551 Pain in right hip: Secondary | ICD-10-CM | POA: Diagnosis not present

## 2018-02-25 MED ORDER — HYDROCODONE-ACETAMINOPHEN 5-325 MG PO TABS: 1.0000 | ORAL_TABLET | Freq: Four times a day (QID) | ORAL | 0 refills | Status: DC | PRN

## 2018-02-25 NOTE — Progress Notes (Signed)
HPI Ms. Chelsea Bowman  returns today follow-up of her right hip injection.  She had a right hip trochanteric injection on 02/11/2018.  States the injection helped some but did not alleviate all of her pain.  She did have pain more in the back of the hip in the groin area.  Again radiograph showed some sclerotic changes but overall the hip joint was well-maintained.  Review of systems: Please see HPI otherwise negative  Physical exam right hip she has pain with internal/external rotation more so with internal rotation of the right hip.  Tenderness over the right trochanteric region.  Fluid range of motion of the hip otherwise.  Impression: Right hip pain  Plan: We will obtain an MRI of her right hip to evaluate the hip cartilage due to the minimal findings on radiographs for her continued hip pain.  Have her follow-up after the MRI to go over results and discuss further treatment.  She is given some Norco for pain which she will use sparingly.  Questions encouraged and answered.  See her back in approximately 2 weeks

## 2018-03-08 ENCOUNTER — Telehealth (INDEPENDENT_AMBULATORY_CARE_PROVIDER_SITE_OTHER): Payer: Self-pay | Admitting: Physician Assistant

## 2018-03-08 NOTE — Telephone Encounter (Signed)
Patient wanted to set up an appointment for an MRI review but Gil's first available is not until 9/4. She is saying he told her to come in as soon as possible after the MRI. Should she be seen sooner? Patients # [phone]

## 2018-03-08 NOTE — Telephone Encounter (Signed)
I'm sorry we can't work her in any sooner. We have to go with our first available  He gets the results, if it's anything serious or worrisome, he will call her

## 2018-03-10 ENCOUNTER — Ambulatory Visit
Admission: RE | Admit: 2018-03-10 | Discharge: 2018-03-10 | Disposition: A | Discharge status: Home / Self Care | Payer: Medicare HMO | Source: Ambulatory Visit | Attending: Physician Assistant | Admitting: Physician Assistant

## 2018-03-10 DIAGNOSIS — M25551 Pain in right hip: Secondary | ICD-10-CM

## 2018-03-14 ENCOUNTER — Telehealth (INDEPENDENT_AMBULATORY_CARE_PROVIDER_SITE_OTHER): Payer: Self-pay | Admitting: Physician Assistant

## 2018-03-14 NOTE — Telephone Encounter (Signed)
Patient is requesting rx refill on norco. Patients # [id number]

## 2018-03-14 NOTE — Telephone Encounter (Signed)
We cannot provide Norco at this point until we have a better diagnosis of what is going on with her pain.  I believe Chelsea Bowman had ordered an MRI for her hip.  Rather have her try something like Aleve or Advil.

## 2018-03-14 NOTE — Telephone Encounter (Signed)
Please advise 

## 2018-03-15 ENCOUNTER — Other Ambulatory Visit (INDEPENDENT_AMBULATORY_CARE_PROVIDER_SITE_OTHER): Payer: Self-pay

## 2018-03-15 MED ORDER — TRAMADOL HCL 50 MG PO TABS: 50.0000 mg | ORAL_TABLET | Freq: Three times a day (TID) | ORAL | 0 refills | Status: AC | PRN

## 2018-03-15 NOTE — Telephone Encounter (Signed)
Please advise as as well. Her MRI is completed and in her chart

## 2018-03-15 NOTE — Telephone Encounter (Signed)
Patient aware of the below message I sent her in some Tramadol She said she would discuss an injection at her visit next Wednesday

## 2018-03-15 NOTE — Telephone Encounter (Signed)
Mild to moderate OA of hip .  Could give her tramadol and offer a hip injection with [place].

## 2018-03-20 ENCOUNTER — Ambulatory Visit (INDEPENDENT_AMBULATORY_CARE_PROVIDER_SITE_OTHER): Payer: Medicare HMO | Admitting: Physician Assistant

## 2018-03-20 ENCOUNTER — Encounter (INDEPENDENT_AMBULATORY_CARE_PROVIDER_SITE_OTHER): Payer: Self-pay | Admitting: Physician Assistant

## 2018-03-20 DIAGNOSIS — M25551 Pain in right hip: Secondary | ICD-10-CM

## 2018-03-20 MED ORDER — HYDROCODONE-ACETAMINOPHEN 5-325 MG PO TABS: 1.0000 | ORAL_TABLET | Freq: Four times a day (QID) | ORAL | 0 refills | Status: AC | PRN

## 2018-03-20 NOTE — Progress Notes (Signed)
Office Visit Note   Patient: Chelsea Bowman           Date of Birth: 1941-04-25           MRN: [id number] Visit Date: 03/20/2018              Requested by: Florene Glen, NP [address] [place], [place] [address] PCP: Florene Glen, NP   Assessment & Plan: Visit Diagnoses:  1. Pain of right hip joint     Plan: We will have her undergo an intra-articular injection of the right hip both for diagnostic and hopefully therapeutic purposes.  Follow-up after the injection to see what type of results she had with this.  Questions were encouraged and answered at length.  She was given a refill on Norco due to significant pain she is having in the right hip.  Follow-Up Instructions: Return for Dr. Magnus Ivan 2 weeks s/p hip injection.   Orders:  No orders of the defined types were placed in this encounter.  Meds ordered this encounter  Medications  . HYDROcodone-acetaminophen (NORCO) 5-325 MG tablet    Sig: Take 1 tablet by mouth every 6 (six) hours as needed for moderate pain.    Dispense:  20 tablet    Refill:  0      Procedures: No procedures performed   Clinical Data: No additional findings.   Subjective: Chief Complaint  Patient presents with  . Right Hip - Follow-up    HPI Mrs. Pennebaker returns today for follow-up of her right hip pain status post MRI.  She continues to have pain in the right buttocks region and also in the groin region particularly at night in the right hip. MRI right hip showed mild to moderate right hip osteoarthritis with degenerative tearing of the anterior superior and superior right labrum.  Also tendinosis involving the right gluteus medius and minimus and hamstrings without frank tear.  Trochanteric bursitis left greater than right hip. Review of Systems Please see HPI  Objective: Vital Signs: There were no vitals taken for this visit.  Physical Exam General: Well-developed well-nourished female no acute distress mood affect  appropriate Psych: Alert and oriented x3 Ortho Exam Right hip she has fluid range of motion extreme internal rotation causes some discomfort.  Tenderness over the right trochanteric region.  Tenderness over the right lower lumbar buttocks region. Specialty Comments:  No specialty comments available.  Imaging: No results found.   PMFS History: Patient Active Problem List   Diagnosis Date Noted  . Morbid obesity due to excess calories (HCC) complicated by dm/ hyperlipidemia/hbp 11/03/2017  . DOE (dyspnea on exertion) 09/11/2017  . Pseudoarthrosis of lumbar spine 01/12/2016  . 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 Arthritis   . CHF (congestive heart failure) (HCC)    takes Furosemide daily along with Potassium  . Chronic back pain    pseudoarthrosis  . Depression    takes Prozac daily  . Diabetes mellitus without complication (HCC)    takes Metformin daily  . Environmental allergies    takes Claritin daily  . GERD (gastroesophageal reflux disease)    watches what she eats  . Headache   . Hemorrhoids   . History of colon polyps    benign  . Hyperlipidemia    takes Lovastatin daily  . Hypertension    takes Coreg daily  . Hypothyroidism    takes Synthroid daily  . Joint pain   .  Joint swelling   . Nocturia   . Osteoporosis   . Peripheral neuropathy   . Pneumonia 11/2014  . PONV (postoperative nausea and vomiting)   . Restless leg    takes Mirapex daily  . Seizures (HCC)    had 2 but years and years ago.Was on Phenobarbital when she was 77yrs old  . Shortness of breath dyspnea    with exertion occasionally.Has Albuterol if needed  . Sleep apnea   . Urinary frequency   . Weakness    numbness and tingling in hands and feet    No family history on file.  Past Surgical History:  Procedure Laterality Date  . BACK SURGERY     x 4  . CERVICAL FUSION     anterior  . CHOLECYSTECTOMY   54yrs ago  . COLONOSCOPY    . LUMBAR PUNCTURE    . POSTERIOR CERVICAL FUSION/FORAMINOTOMY N/A 07/05/2015   Procedure: Posterior Cervical Fusion with lateral mass fixation - Cervical two-three;  Surgeon: Donalee Citrin, MD;  Location: [facility];  Service: Neurosurgery;  Laterality: N/A;  . POSTERIOR LUMBAR FUSION  01/12/2016  . VAGINA SURGERY  56yrs ago   unsure of name    Social History   Occupational History  . Not on file  Tobacco Use  . Smoking status: Never Smoker  . Smokeless tobacco: Never Used  Substance and Sexual Activity  . Alcohol use: No  . Drug use: No  . Sexual activity: Not on file

## 2018-03-22 ENCOUNTER — Other Ambulatory Visit (INDEPENDENT_AMBULATORY_CARE_PROVIDER_SITE_OTHER): Payer: Self-pay

## 2018-03-22 DIAGNOSIS — M25551 Pain in right hip: Secondary | ICD-10-CM

## 2018-03-29 ENCOUNTER — Encounter (INDEPENDENT_AMBULATORY_CARE_PROVIDER_SITE_OTHER): Payer: Self-pay | Admitting: Physical Medicine and Rehabilitation

## 2018-03-29 ENCOUNTER — Ambulatory Visit (INDEPENDENT_AMBULATORY_CARE_PROVIDER_SITE_OTHER): Payer: Medicare HMO | Admitting: Physical Medicine and Rehabilitation

## 2018-03-29 ENCOUNTER — Ambulatory Visit (INDEPENDENT_AMBULATORY_CARE_PROVIDER_SITE_OTHER): Payer: Self-pay

## 2018-03-29 DIAGNOSIS — M25551 Pain in right hip: Secondary | ICD-10-CM

## 2018-03-29 MED ORDER — TRIAMCINOLONE ACETONIDE 40 MG/ML IJ SUSP
80.0000 mg | INTRAMUSCULAR | Status: AC | PRN
  Administered 2018-03-29: 80 mg via INTRA_ARTICULAR

## 2018-03-29 MED ORDER — BUPIVACAINE HCL 0.5 % IJ SOLN
3.0000 mL | INTRAMUSCULAR | Status: AC | PRN
  Administered 2018-03-29: 3 mL via INTRA_ARTICULAR

## 2018-03-29 NOTE — Progress Notes (Signed)
Chelsea AmabileLinda S Bowman - 77 y.o. female MRN [id number]  Date of birth: 06/20/1941  Office Visit Note: Visit Date: 03/29/2018 PCP: Florene GlenWingate, Katie, NP Referred by: Florene GlenWingate, Katie, NP  Subjective: Chief Complaint  Patient presents with  . Right Hip - Pain  . Right Leg - Pain  . Right Lower Leg - Numbness   HPI: Chelsea Bowman is a 77 year old female that comes in today for diagnostic and therapeutic right intra-articular anesthetic hip arthrogram.  She is having chronic worsening right hip and groin pain and some thigh pain.   ROS Otherwise per HPI.  Assessment & Plan: Visit Diagnoses:  1. Pain in right hip     Plan: Findings:  Diagnostic note for therapeutic right anesthetic hip arthrogram.  Patient had some relief during the anesthetic phase but was still having some catching sensation with ambulation.    Meds & Orders: No orders of the defined types were placed in this encounter.   Orders Placed This Encounter  Procedures  . Large Joint Inj: R hip joint  . XR C-ARM NO REPORT    Follow-up: Return for Dr. Magnus IvanBlackman.   Procedures: Large Joint Inj: R hip joint on 03/29/2018 8:55 AM Indications: pain and diagnostic evaluation Details: 22 G needle, anterior approach  Arthrogram: Yes  Medications: 3 mL bupivacaine 0.5 %; 80 mg triamcinolone acetonide 40 MG/ML Outcome: tolerated well, no immediate complications  Arthrogram demonstrated excellent flow of contrast throughout the joint surface without extravasation or obvious defect.  The patient had mild relief of symptoms during the anesthetic phase of the injection.  Procedure, treatment alternatives, risks and benefits explained, specific risks discussed. Consent was given by the patient. Immediately prior to procedure a time out was called to verify the correct patient, procedure, equipment, support staff and site/side marked as required. Patient was prepped and draped in the usual sterile fashion.      No notes on file   Clinical  History: IMPRESSION: Tendinosis of the right gluteus, medius and minimus and right hamstrings at their origin without tear. Tendinosis is worse in the gluteal tendons.  Fluid in the trochanteric bursa consistent with bursitis, greater on the left.  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  Status post lower lumbar fusion.   Electronically Signed   By: Drusilla Kannerhomas  Dalessio M.D.   On: 03/11/2018 09:58   She reports that she has never smoked. She has never used smokeless tobacco. No results for input(s): HGBA1C, LABURIC in the last 8760 hours.  Objective:  VS:  HT:    WT:   BMI:     BP:   HR: bpm  TEMP: ( )  RESP:  Physical Exam  Ortho Exam Imaging: Xr C-arm No Report  Result Date: 03/29/2018 Please see Notes tab for imaging impression.   Past Medical/Family/Surgical/Social History: Medications & Allergies reviewed per EMR, new medications updated. Patient Active Problem List   Diagnosis Date Noted  . Morbid obesity due to excess calories (HCC) complicated by dm/ hyperlipidemia/hbp 11/03/2017  . DOE (dyspnea on exertion) 09/11/2017  . Pseudoarthrosis of lumbar spine 01/12/2016  . 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 Arthritis   . CHF (congestive heart failure) (HCC)    takes Furosemide daily along with Potassium  . Chronic back pain    pseudoarthrosis  . Depression    takes Prozac daily  . Diabetes mellitus  without complication (HCC)    takes Metformin daily  . Environmental allergies    takes Claritin daily  . GERD (gastroesophageal reflux disease)    watches what she eats  . Headache   . Hemorrhoids   . History of colon polyps    benign  . Hyperlipidemia    takes Lovastatin daily  . Hypertension    takes Coreg daily  . Hypothyroidism    takes Synthroid daily  . Joint pain   .  Joint swelling   . Nocturia   . Osteoporosis   . Peripheral neuropathy   . Pneumonia 11/2014  . PONV (postoperative nausea and vomiting)   . Restless leg    takes Mirapex daily  . Seizures (HCC)    had 2 but years and years ago.Was on Phenobarbital when she was 77yrs old  . Shortness of breath dyspnea    with exertion occasionally.Has Albuterol if needed  . Sleep apnea   . Urinary frequency   . Weakness    numbness and tingling in hands and feet   History reviewed. No pertinent family history. Past Surgical History:  Procedure Laterality Date  . BACK SURGERY     x 4  . CERVICAL FUSION     anterior  . CHOLECYSTECTOMY  24yrs ago  . COLONOSCOPY    . LUMBAR PUNCTURE    . POSTERIOR CERVICAL FUSION/FORAMINOTOMY N/A 07/05/2015   Procedure: Posterior Cervical Fusion with lateral mass fixation - Cervical two-three;  Surgeon: Donalee Citrin, MD;  Location: [facility];  Service: Neurosurgery;  Laterality: N/A;  . POSTERIOR LUMBAR FUSION  01/12/2016  . VAGINA SURGERY  48yrs ago   unsure of name    Social History   Occupational History  . Not on file  Tobacco Use  . Smoking status: Never Smoker  . Smokeless tobacco: Never Used  Substance and Sexual Activity  . Alcohol use: No  . Drug use: No  . Sexual activity: Not on file

## 2018-03-29 NOTE — Progress Notes (Signed)
Numeric Pain Rating Scale and Functional Assessment Average Pain 8   In the last MONTH (on 0-10 scale) has pain interfered with the following?  1. General activity like being  able to carry out your everyday physical activities such as walking, climbing stairs, carrying groceries, or moving a chair?  Rating(10)    +Driver, -BT, -Dye Allergies. 

## 2018-03-29 NOTE — Patient Instructions (Signed)

## 2018-04-11 ENCOUNTER — Ambulatory Visit (INDEPENDENT_AMBULATORY_CARE_PROVIDER_SITE_OTHER): Payer: Medicare HMO | Admitting: Orthopaedic Surgery

## 2018-04-22 ENCOUNTER — Ambulatory Visit (INDEPENDENT_AMBULATORY_CARE_PROVIDER_SITE_OTHER): Payer: Medicare HMO | Admitting: Orthopaedic Surgery

## 2018-04-22 ENCOUNTER — Encounter (INDEPENDENT_AMBULATORY_CARE_PROVIDER_SITE_OTHER): Payer: Self-pay | Admitting: Orthopaedic Surgery

## 2018-04-22 DIAGNOSIS — M25551 Pain in right hip: Secondary | ICD-10-CM

## 2018-04-22 NOTE — Progress Notes (Signed)
The patient comes in today after having both a trochanteric steroid injection right trochanteric area as well as an intra-articular injection of her right hip.  She also has chronic low back pain and sciatic pain.  She says the injection helped for her shoulder and a lot of activity causes her pain.  She is had an MRI of that hip that showed mild to moderate osteoarthritis.  She is not interested in any type of surgical intervention at this point.  She would like to consider therapy.  On exam I can easily put her right hip to internal extra rotation with no significant pain at all.  Her pain seems to be more of the trochanteric area in the low back.  At this standpoint I agree on no surgery as does she.  I will give her a prescription for outpatient physical therapy near her home in [place].  We can see her back in 3 months as he has been doing overall but no x-rays are needed.

## 2018-07-25 ENCOUNTER — Encounter (INDEPENDENT_AMBULATORY_CARE_PROVIDER_SITE_OTHER): Payer: Self-pay | Admitting: Orthopaedic Surgery

## 2018-07-25 ENCOUNTER — Ambulatory Visit (INDEPENDENT_AMBULATORY_CARE_PROVIDER_SITE_OTHER): Payer: Medicare HMO | Admitting: Orthopaedic Surgery

## 2018-07-25 DIAGNOSIS — M25551 Pain in right hip: Secondary | ICD-10-CM

## 2018-07-25 MED ORDER — GABAPENTIN 300 MG PO CAPS: 300.0000 mg | ORAL_CAPSULE | Freq: Two times a day (BID) | ORAL | 3 refills | Status: DC

## 2018-07-25 NOTE — Progress Notes (Signed)
The patient is here for follow-up after having a trochanteric injection on her right hip as well as an intra-articular injection in her right hip joint.  She says it did not really help her much.  She has had a history of significant back issues.  On exam I can easily put her right and left hip through full internal and external rotation and her pain seems to be more of the iliac crest area and around the back on both sides.  There is no rash.  With had an MRI of her right hip that showed mild to moderate arthritic changes but her plain films are normal and again her hip exam shows fluid range of motion.  Also she states the injections did not help at all.  With that being said I feel that this is not truly a hip issue because she should have gotten some relief.  Based on my clinical exam and the failure injections I feel that this is more of a spine issue.  She has had Neurontin in the past so we will try that at 300 mg twice a day.  I do feel that she needs to see her spine specialist again at some point.  I can see her in 4 weeks to see if this is helped any.  All question concerns were answered and addressed.

## 2018-08-17 ENCOUNTER — Other Ambulatory Visit (INDEPENDENT_AMBULATORY_CARE_PROVIDER_SITE_OTHER): Payer: Self-pay | Admitting: Orthopaedic Surgery

## 2018-08-19 NOTE — Telephone Encounter (Signed)
Please advise
